# Patient Record
Sex: Male | Born: 1975
Health system: Southern US, Community
[De-identification: ages and names within clinical notes are randomized; demographics above are authoritative.]

## PROBLEM LIST (undated history)

## (undated) DIAGNOSIS — T50Z95A Adverse effect of other vaccines and biological substances, initial encounter: Secondary | ICD-10-CM

## (undated) DIAGNOSIS — E739 Lactose intolerance, unspecified: Secondary | ICD-10-CM

## (undated) DIAGNOSIS — R5383 Other fatigue: Secondary | ICD-10-CM

## (undated) DIAGNOSIS — G4730 Sleep apnea, unspecified: Secondary | ICD-10-CM

## (undated) DIAGNOSIS — M109 Gout, unspecified: Secondary | ICD-10-CM

## (undated) DIAGNOSIS — J45909 Unspecified asthma, uncomplicated: Secondary | ICD-10-CM

## (undated) DIAGNOSIS — R0602 Shortness of breath: Secondary | ICD-10-CM

## (undated) DIAGNOSIS — M2550 Pain in unspecified joint: Secondary | ICD-10-CM

## (undated) DIAGNOSIS — I10 Essential (primary) hypertension: Secondary | ICD-10-CM

## (undated) DIAGNOSIS — K5900 Constipation, unspecified: Secondary | ICD-10-CM

## (undated) HISTORY — DX: Shortness of breath: R06.02

## (undated) HISTORY — DX: Pain in unspecified joint: M25.50

## (undated) HISTORY — DX: Sleep apnea, unspecified: G47.30

## (undated) HISTORY — DX: Unspecified asthma, uncomplicated: J45.909

## (undated) HISTORY — DX: Lactose intolerance, unspecified: E73.9

## (undated) HISTORY — DX: Adverse effect of other vaccines and biological substances, initial encounter: T50.Z95A

## (undated) HISTORY — DX: Other fatigue: R53.83

## (undated) HISTORY — DX: Constipation, unspecified: K59.00

---

## 2009-04-09 ENCOUNTER — Emergency Department (HOSPITAL_COMMUNITY): Admission: EM | Admit: 2009-04-09 | Discharge: 2009-04-09 | Payer: Self-pay | Admitting: Emergency Medicine

## 2017-03-31 DIAGNOSIS — M109 Gout, unspecified: Secondary | ICD-10-CM | POA: Diagnosis not present

## 2017-03-31 DIAGNOSIS — R030 Elevated blood-pressure reading, without diagnosis of hypertension: Secondary | ICD-10-CM | POA: Diagnosis not present

## 2017-05-06 DIAGNOSIS — R030 Elevated blood-pressure reading, without diagnosis of hypertension: Secondary | ICD-10-CM | POA: Diagnosis not present

## 2017-05-06 DIAGNOSIS — Z0000 Encounter for general adult medical examination without abnormal findings: Secondary | ICD-10-CM | POA: Diagnosis not present

## 2017-06-14 DIAGNOSIS — M109 Gout, unspecified: Secondary | ICD-10-CM | POA: Diagnosis not present

## 2017-06-14 DIAGNOSIS — I10 Essential (primary) hypertension: Secondary | ICD-10-CM | POA: Diagnosis not present

## 2017-06-14 DIAGNOSIS — M79671 Pain in right foot: Secondary | ICD-10-CM | POA: Diagnosis not present

## 2017-09-17 DIAGNOSIS — J302 Other seasonal allergic rhinitis: Secondary | ICD-10-CM | POA: Diagnosis not present

## 2017-09-17 DIAGNOSIS — J069 Acute upper respiratory infection, unspecified: Secondary | ICD-10-CM | POA: Diagnosis not present

## 2017-09-17 DIAGNOSIS — J9801 Acute bronchospasm: Secondary | ICD-10-CM | POA: Diagnosis not present

## 2017-12-17 DIAGNOSIS — M109 Gout, unspecified: Secondary | ICD-10-CM | POA: Diagnosis not present

## 2017-12-17 DIAGNOSIS — R21 Rash and other nonspecific skin eruption: Secondary | ICD-10-CM | POA: Diagnosis not present

## 2017-12-17 DIAGNOSIS — I10 Essential (primary) hypertension: Secondary | ICD-10-CM | POA: Diagnosis not present

## 2018-07-07 DIAGNOSIS — Z0000 Encounter for general adult medical examination without abnormal findings: Secondary | ICD-10-CM | POA: Diagnosis not present

## 2018-07-07 DIAGNOSIS — I10 Essential (primary) hypertension: Secondary | ICD-10-CM | POA: Diagnosis not present

## 2018-07-07 DIAGNOSIS — M109 Gout, unspecified: Secondary | ICD-10-CM | POA: Diagnosis not present

## 2020-05-19 ENCOUNTER — Other Ambulatory Visit: Payer: Self-pay

## 2020-05-19 ENCOUNTER — Observation Stay (HOSPITAL_BASED_OUTPATIENT_CLINIC_OR_DEPARTMENT_OTHER)
Admission: EM | Admit: 2020-05-19 | Discharge: 2020-05-20 | Disposition: A | Discharge status: Home / Self Care | Payer: 59 | Attending: Internal Medicine | Admitting: Internal Medicine

## 2020-05-19 ENCOUNTER — Encounter (HOSPITAL_BASED_OUTPATIENT_CLINIC_OR_DEPARTMENT_OTHER): Payer: Self-pay | Admitting: Emergency Medicine

## 2020-05-19 ENCOUNTER — Emergency Department (HOSPITAL_BASED_OUTPATIENT_CLINIC_OR_DEPARTMENT_OTHER): Payer: 59

## 2020-05-19 DIAGNOSIS — E872 Acidosis: Secondary | ICD-10-CM | POA: Diagnosis not present

## 2020-05-19 DIAGNOSIS — R569 Unspecified convulsions: Secondary | ICD-10-CM | POA: Insufficient documentation

## 2020-05-19 DIAGNOSIS — Z79899 Other long term (current) drug therapy: Secondary | ICD-10-CM | POA: Insufficient documentation

## 2020-05-19 DIAGNOSIS — R0602 Shortness of breath: Secondary | ICD-10-CM | POA: Diagnosis present

## 2020-05-19 DIAGNOSIS — I10 Essential (primary) hypertension: Secondary | ICD-10-CM | POA: Diagnosis not present

## 2020-05-19 DIAGNOSIS — Z20822 Contact with and (suspected) exposure to covid-19: Secondary | ICD-10-CM | POA: Diagnosis not present

## 2020-05-19 DIAGNOSIS — E8729 Other acidosis: Secondary | ICD-10-CM

## 2020-05-19 DIAGNOSIS — G9340 Encephalopathy, unspecified: Secondary | ICD-10-CM | POA: Diagnosis present

## 2020-05-19 DIAGNOSIS — R0902 Hypoxemia: Secondary | ICD-10-CM

## 2020-05-19 DIAGNOSIS — R0689 Other abnormalities of breathing: Secondary | ICD-10-CM

## 2020-05-19 DIAGNOSIS — J9601 Acute respiratory failure with hypoxia: Secondary | ICD-10-CM | POA: Diagnosis not present

## 2020-05-19 DIAGNOSIS — J9602 Acute respiratory failure with hypercapnia: Secondary | ICD-10-CM | POA: Insufficient documentation

## 2020-05-19 DIAGNOSIS — G4733 Obstructive sleep apnea (adult) (pediatric): Secondary | ICD-10-CM | POA: Insufficient documentation

## 2020-05-19 HISTORY — DX: Essential (primary) hypertension: I10

## 2020-05-19 HISTORY — DX: Gout, unspecified: M10.9

## 2020-05-19 LAB — I-STAT ARTERIAL BLOOD GAS, ED
Acid-Base Excess: 6 mmol/L — ABNORMAL HIGH (ref 0.0–2.0)
Bicarbonate: 33.6 mmol/L — ABNORMAL HIGH (ref 20.0–28.0)
Calcium, Ion: 1.24 mmol/L (ref 1.15–1.40)
HCT: 44 % (ref 39.0–52.0)
Hemoglobin: 15 g/dL (ref 13.0–17.0)
O2 Saturation: 86 %
Patient temperature: 98.2
Potassium: 3.6 mmol/L (ref 3.5–5.1)
Sodium: 142 mmol/L (ref 135–145)
TCO2: 35 mmol/L — ABNORMAL HIGH (ref 22–32)
pCO2 arterial: 59.3 mmHg — ABNORMAL HIGH (ref 32.0–48.0)
pH, Arterial: 7.36 (ref 7.350–7.450)
pO2, Arterial: 55 mmHg — ABNORMAL LOW (ref 83.0–108.0)

## 2020-05-19 LAB — RESP PANEL BY RT-PCR (FLU A&B, COVID) ARPGX2
Influenza A by PCR: NEGATIVE
Influenza B by PCR: NEGATIVE
SARS Coronavirus 2 by RT PCR: NEGATIVE

## 2020-05-19 LAB — RAPID URINE DRUG SCREEN, HOSP PERFORMED
Amphetamines: NOT DETECTED
Barbiturates: NOT DETECTED
Benzodiazepines: NOT DETECTED
Cocaine: NOT DETECTED
Opiates: NOT DETECTED
Tetrahydrocannabinol: NOT DETECTED

## 2020-05-19 LAB — CBC WITH DIFFERENTIAL/PLATELET
Abs Immature Granulocytes: 0.04 10*3/uL (ref 0.00–0.07)
Basophils Absolute: 0 10*3/uL (ref 0.0–0.1)
Basophils Relative: 1 %
Eosinophils Absolute: 0.1 10*3/uL (ref 0.0–0.5)
Eosinophils Relative: 2 %
HCT: 44.2 % (ref 39.0–52.0)
Hemoglobin: 14.6 g/dL (ref 13.0–17.0)
Immature Granulocytes: 1 %
Lymphocytes Relative: 33 %
Lymphs Abs: 1.9 10*3/uL (ref 0.7–4.0)
MCH: 28.6 pg (ref 26.0–34.0)
MCHC: 33 g/dL (ref 30.0–36.0)
MCV: 86.7 fL (ref 80.0–100.0)
Monocytes Absolute: 0.4 10*3/uL (ref 0.1–1.0)
Monocytes Relative: 6 %
Neutro Abs: 3.3 10*3/uL (ref 1.7–7.7)
Neutrophils Relative %: 57 %
Platelets: 166 10*3/uL (ref 150–400)
RBC: 5.1 MIL/uL (ref 4.22–5.81)
RDW: 14.1 % (ref 11.5–15.5)
WBC: 5.7 10*3/uL (ref 4.0–10.5)
nRBC: 0 % (ref 0.0–0.2)

## 2020-05-19 LAB — BASIC METABOLIC PANEL
Anion gap: 8 (ref 5–15)
BUN: 14 mg/dL (ref 6–20)
CO2: 30 mmol/L (ref 22–32)
Calcium: 8.9 mg/dL (ref 8.9–10.3)
Chloride: 102 mmol/L (ref 98–111)
Creatinine, Ser: 0.92 mg/dL (ref 0.61–1.24)
GFR, Estimated: >60 mL/min (ref >60)
Glucose, Bld: 125 mg/dL — ABNORMAL HIGH (ref 70–99)
Potassium: 3.6 mmol/L (ref 3.5–5.1)
Sodium: 140 mmol/L (ref 135–145)

## 2020-05-19 MED ORDER — IOHEXOL 350 MG/ML SOLN
100.0000 mL | Freq: Once | INTRAVENOUS | Status: AC
  Administered 2020-05-19: 100 mL via INTRAVENOUS

## 2020-05-19 MED ORDER — SODIUM CHLORIDE 0.9% FLUSH
3.0000 mL | Freq: Two times a day (BID) | INTRAVENOUS | Status: DC
  Administered 2020-05-20: 3 mL via INTRAVENOUS

## 2020-05-19 MED ORDER — ONDANSETRON HCL 4 MG/2ML IJ SOLN: 4.0000 mg | Freq: Four times a day (QID) | INTRAMUSCULAR | Status: DC | PRN

## 2020-05-19 MED ORDER — SODIUM CHLORIDE 0.9% FLUSH: 3.0000 mL | INTRAVENOUS | Status: DC | PRN

## 2020-05-19 MED ORDER — ACETAMINOPHEN 325 MG PO TABS
650.0000 mg | ORAL_TABLET | Freq: Once | ORAL | Status: AC
  Administered 2020-05-19: 650 mg via ORAL
  Filled 2020-05-19: qty 2

## 2020-05-19 MED ORDER — SODIUM CHLORIDE 0.9% FLUSH
3.0000 mL | Freq: Two times a day (BID) | INTRAVENOUS | Status: DC
  Administered 2020-05-19: 3 mL via INTRAVENOUS

## 2020-05-19 MED ORDER — ACETAMINOPHEN 650 MG RE SUPP: 650.0000 mg | Freq: Four times a day (QID) | RECTAL | Status: DC | PRN

## 2020-05-19 MED ORDER — ACETAMINOPHEN 325 MG PO TABS: 650.0000 mg | ORAL_TABLET | Freq: Four times a day (QID) | ORAL | Status: DC | PRN

## 2020-05-19 MED ORDER — SODIUM CHLORIDE 0.9 % IV SOLN: 250.0000 mL | INTRAVENOUS | Status: DC | PRN

## 2020-05-19 MED ORDER — ONDANSETRON HCL 4 MG PO TABS: 4.0000 mg | ORAL_TABLET | Freq: Four times a day (QID) | ORAL | Status: DC | PRN

## 2020-05-19 MED ORDER — ONDANSETRON HCL 4 MG/2ML IJ SOLN
INTRAMUSCULAR | Status: AC
  Administered 2020-05-19: 4 mg
  Filled 2020-05-19: qty 2

## 2020-05-19 MED ORDER — ENOXAPARIN SODIUM 40 MG/0.4ML ~~LOC~~ SOLN
40.0000 mg | SUBCUTANEOUS | Status: DC
  Administered 2020-05-19: 40 mg via SUBCUTANEOUS
  Filled 2020-05-19: qty 0.4

## 2020-05-19 NOTE — ED Notes (Signed)
He is resting comfortably and arouses easily. He is oriented x 4 with clear speech. He falls to sleep soon after my assessment.

## 2020-05-19 NOTE — ED Notes (Signed)
Patient returned from CT

## 2020-05-19 NOTE — Progress Notes (Signed)
Pt refusing cpap for the night. ?

## 2020-05-19 NOTE — ED Notes (Signed)
Called bed placement at 10:30 no beds until discharges may be 2-3 o'clock

## 2020-05-19 NOTE — ED Provider Notes (Signed)
[facility] Provider Note: Lowella DellJ. Lane Saraiya Kozma, MD, FACEP  CSN: [id number] MRN: [id number] ARRIVAL: 05/19/20 at 0337 ROOM: [facility]   CHIEF COMPLAINT  Sleep Problem   HISTORY OF PRESENT ILLNESS  05/19/20 4:05 AM Donald Dixon is a 44 y.o. male who is of stocky build.  He has strong family history of obstructive sleep apnea and his wife has long suspected he has obstructive sleep apnea due to heavy snoring and apparent apneic events while sleeping.  He has never had a problem with excessive daytime sleepiness until returning from a 3-week trip to [place] a week ago.  Since then he has been hypersomnolent, even falling asleep during a conversation.   This morning at 0215 his wife awakened finding him on the floor having apparently fallen out of bed.  He was unresponsive, cyanotic and was having difficulty breathing.  Specifically he was having what appeared to be an effort to breathe through an obstruction.  He was incontinent of urine prior to this, but his wife did not witness any tonic-clonic activity when she saw him.  She briefly perform chest compressions and called EMS who arrived and evaluated him.  His blood sugar and blood pressure were adequate.  He did declined transport to the ED.  He subsequently had several episodes of vomiting.  On arrival here his oxygen saturation has been 86-90% on room air.  This improved to 91 to 92% on 2 L by nasal cannula.  The patient states he feels tired but denies any pain or injury.   Past Medical History:  Diagnosis Date  . Gout   . Hypertension     History reviewed. No pertinent surgical history.  No family history on file.  Social History   Tobacco Use  . Smoking status: Never Smoker  . Smokeless tobacco: Never Used  Vaping Use  . Vaping Use: Never used  Substance Use Topics  . Alcohol use: Never  . Drug use: Never    Prior to Admission medications   Medication Sig Start Date End Date Taking? Authorizing Provider    allopurinol (ZYLOPRIM) 100 MG tablet Take 100 mg by mouth daily.   Yes [provider]  lisinopril (ZESTRIL) 5 MG tablet Take 5 mg by mouth daily.   Yes [provider]    Allergies Patient has no known allergies.   REVIEW OF SYSTEMS  Negative except as noted here or in the History of Present Illness.   PHYSICAL EXAMINATION  Initial Vital Signs Blood pressure (!) 133/100, pulse 72, resp. rate (!) 21, height 6' (1.829 m), weight (!) 140.6 kg, SpO2 91 %.  Examination General: Well-developed, obese (BMI 42.04) male in no acute distress; appearance consistent with age of record HENT: normocephalic; superficial bite mark to right anterior tongue; Mallampati 4 airway (no better than 3 with use of tongue depressor) Eyes: pupils equal, round and reactive to light; extraocular muscles intact Neck: supple Heart: regular rate and rhythm Lungs: clear to auscultation bilaterally Abdomen: soft; nondistended; nontender; bowel sounds present Extremities: No deformity; full range of motion; pulses normal Neurologic: Awake, sleepy but conversant and cooperative; oriented; motor function intact in all extremities and symmetric; no facial droop Skin: Warm and dry Psychiatric: Flat affect   RESULTS  Summary of this visit's results, reviewed and interpreted by myself:   EKG Interpretation  Date/Time:  Sunday May 19 2020 03:53:57 EST Ventricular Rate:  70 PR Interval:    QRS Duration: 99 QT Interval:  427 QTC Calculation: 461 R Axis:  40 Text Interpretation: Sinus rhythm Normal ECG No previous ECGs available Confirmed by Ozie Lupe, Jonny Ruiz ([id number]) on 05/19/2020 3:57:17 AM      Laboratory Studies: Results for orders placed or performed during the hospital encounter of 05/19/20 (from the past 24 hour(s))  CBC with Differential/Platelet     Status: None   Collection Time: 05/19/20  4:17 AM  Result Value Ref Range   WBC 5.7 4.0 - 10.5 K/uL   RBC 5.10 4.22 - 5.81 MIL/uL    Hemoglobin 14.6 13.0 - 17.0 g/dL   HCT 96.7 39 - 52 %   MCV 86.7 80.0 - 100.0 fL   MCH 28.6 26.0 - 34.0 pg   MCHC 33.0 30.0 - 36.0 g/dL   RDW 89.3 81.0 - 17.5 %   Platelets 166 150 - 400 K/uL   nRBC 0.0 0.0 - 0.2 %   Neutrophils Relative % 57 %   Neutro Abs 3.3 1.7 - 7.7 K/uL   Lymphocytes Relative 33 %   Lymphs Abs 1.9 0.7 - 4.0 K/uL   Monocytes Relative 6 %   Monocytes Absolute 0.4 0.1 - 1.0 K/uL   Eosinophils Relative 2 %   Eosinophils Absolute 0.1 0.0 - 0.5 K/uL   Basophils Relative 1 %   Basophils Absolute 0.0 0.0 - 0.1 K/uL   Immature Granulocytes 1 %   Abs Immature Granulocytes 0.04 0.00 - 0.07 K/uL  Basic metabolic panel     Status: Abnormal   Collection Time: 05/19/20  4:17 AM  Result Value Ref Range   Sodium 140 135 - 145 mmol/L   Potassium 3.6 3.5 - 5.1 mmol/L   Chloride 102 98 - 111 mmol/L   CO2 30 22 - 32 mmol/L   Glucose, Bld 125 (H) 70 - 99 mg/dL   BUN 14 6 - 20 mg/dL   Creatinine, Ser 1.02 0.61 - 1.24 mg/dL   Calcium 8.9 8.9 - 58.5 mg/dL   GFR, Estimated >27 >78 mL/min   Anion gap 8 5 - 15  I-Stat arterial blood gas, ([facility] ED)     Status: Abnormal   Collection Time: 05/19/20  4:37 AM  Result Value Ref Range   pH, Arterial 7.360 7.35 - 7.45   pCO2 arterial 59.3 (H) 32 - 48 mmHg   pO2, Arterial 55 (L) 83 - 108 mmHg   Bicarbonate 33.6 (H) 20.0 - 28.0 mmol/L   TCO2 35 (H) 22 - 32 mmol/L   O2 Saturation 86.0 %   Acid-Base Excess 6.0 (H) 0.0 - 2.0 mmol/L   Sodium 142 135 - 145 mmol/L   Potassium 3.6 3.5 - 5.1 mmol/L   Calcium, Ion 1.24 1.15 - 1.40 mmol/L   HCT 44.0 39 - 52 %   Hemoglobin 15.0 13.0 - 17.0 g/dL   Patient temperature 24.2 F    Collection site Radial    Drawn by RT    Sample type ARTERIAL   Rapid urine drug screen (hospital performed)     Status: None   Collection Time: 05/19/20  4:44 AM  Result Value Ref Range   Opiates NONE DETECTED NONE DETECTED   Cocaine NONE DETECTED NONE DETECTED   Benzodiazepines NONE DETECTED NONE DETECTED    Amphetamines NONE DETECTED NONE DETECTED   Tetrahydrocannabinol NONE DETECTED NONE DETECTED   Barbiturates NONE DETECTED NONE DETECTED  Resp Panel by RT-PCR (Flu A&B, Covid) Nasopharyngeal Swab     Status: None   Collection Time: 05/19/20  4:44 AM   Specimen: Nasopharyngeal Swab; Nasopharyngeal(NP) swabs in  vial transport medium  Result Value Ref Range   SARS Coronavirus 2 by RT PCR NEGATIVE NEGATIVE   Influenza A by PCR NEGATIVE NEGATIVE   Influenza B by PCR NEGATIVE NEGATIVE   Imaging Studies: CT Head Wo Contrast  Result Date: 05/19/2020 CLINICAL DATA:  44 year old male with fall, altered mental status and shortness of breath. EXAM: CT HEAD WITHOUT CONTRAST TECHNIQUE: Contiguous axial images were obtained from the base of the skull through the vertex without intravenous contrast. COMPARISON:  None. FINDINGS: Brain: No evidence of acute infarction, hemorrhage, hydrocephalus, extra-axial collection or mass lesion/mass effect. Vascular: No hyperdense vessel or unexpected calcification. Skull: Normal. Negative for fracture or focal lesion. Sinuses/Orbits: No acute finding. Other: None. IMPRESSION: Unremarkable noncontrast head CT. Electronically Signed   By: Harmon Pier M.D.   On: 05/19/2020 06:22   CT Angio Chest PE W and/or Wo Contrast  Result Date: 05/19/2020 CLINICAL DATA:  44 year old male with acute shortness of breath. EXAM: CT ANGIOGRAPHY CHEST WITH CONTRAST TECHNIQUE: Multidetector CT imaging of the chest was performed using the standard protocol during bolus administration of intravenous contrast. Multiplanar CT image reconstructions and MIPs were obtained to evaluate the vascular anatomy. CONTRAST:  OMNIPAQUE IOHEXOL 350 MG/ML SOLN COMPARISON:  None. FINDINGS: Cardiovascular: This is a technically satisfactory study. No pulmonary emboli are identified. There is no evidence of thoracic aortic aneurysm or definite dissection. UPPER limits normal heart size noted. No pericardial  effusion is present. Mediastinum/Nodes: No enlarged mediastinal, hilar, or axillary lymph nodes. Thyroid gland, trachea, and esophagus demonstrate no significant findings. Lungs/Pleura: No evidence of airspace disease, consolidation, nodule, mass, pleural effusion or pneumothorax. Mild dependent and bibasilar atelectasis noted. Upper Abdomen: No acute abnormality.  Hepatic steatosis is present. Musculoskeletal: No acute or suspicious bony abnormalities are noted. Review of the MIP images confirms the above findings. IMPRESSION: 1. No evidence of pulmonary emboli or thoracic aortic aneurysm/dissection. 2. Mild dependent and bibasilar atelectasis. 3. Hepatic steatosis. Electronically Signed   By: Harmon Pier M.D.   On: 05/19/2020 06:29    ED COURSE and MDM  Nursing notes, initial and subsequent vitals signs, including pulse oximetry, reviewed and interpreted by myself.  Vitals:   05/19/20 0400 05/19/20 0447 05/19/20 0520 05/19/20 0530  BP: (!) 133/100 (!) 138/105 (!) 143/102 130/89  Pulse: 72 66 77 68  Resp: (!) 21 20 (!) 22 18  Temp: 98.2 F (36.8 C)     TempSrc: Oral     SpO2: 91% 97% 98% 98%  Weight:      Height:       Medications  iohexol (OMNIPAQUE) 350 MG/ML injection 100 mL (100 mLs Intravenous Contrast Given 05/19/20 0452)  ondansetron (ZOFRAN) 4 MG/2ML injection (4 mg  Given 05/19/20 0520)   6:12 AM The patient's history, especially as given by his wife, is strongly suggestive of obstructive sleep apnea.  Obstructive sleep apnea is often associated with excessive daytime sleepiness and also carries a high risk of seizures.  I suspect the apparent life-threatening event his wife witnessed this morning was in fact due to a generalized tonic-clonic seizure.  He was incontinent of urine and has a bite mark to his tongue.  It is interesting that he has only become somnolent in the past week.  A CT angio chest was performed to look for a pulmonary embolism given his recent travel and hypoxia  at rest.  The results of this are pending but no obvious PE was seen by myself.  A CT scan of  the head was done to evaluate for tumor or other pathology and again nothing was seen by myself.  The patient's persistent hypoxia at rest is concerning and we will have him admitted for further work-up.  He could have pulmonary hypertension as a result of longstanding sleep apnea.  He will no doubt need a sleep study as well as a neurologic evaluation.   6:33 AM No significant findings on CT scans.   PROCEDURES  Procedures  CRITICAL CARE Performed by: Carlisle Beers Melinna Linarez Total critical care time: 35 minutes Critical care time was exclusive of separately billable procedures and treating other patients. Critical care was necessary to treat or prevent imminent or life-threatening deterioration. Critical care was time spent personally by me on the following activities: development of treatment plan with patient and/or surrogate as well as nursing, discussions with consultants, evaluation of patient's response to treatment, examination of patient, obtaining history from patient or surrogate, ordering and performing treatments and interventions, ordering and review of laboratory studies, ordering and review of radiographic studies, pulse oximetry and re-evaluation of patient's condition.   ED DIAGNOSES     ICD-10-CM   1. Hypoxia  R09.02   2. Hypercapnia  R06.89   3. Seizure (HCC)  R56.9   4. Obstructive sleep apnea syndrome in adult  G47.33   5. Compensated respiratory acidosis  E87.2        Suheily Birks, Jonny Ruiz, MD 05/19/20 2229

## 2020-05-19 NOTE — ED Notes (Signed)
I have just given report to Luckey, Charity fundraiser at [facility]; and also to Franklin, Charity fundraiser with [employer]. Pt. And his wife are aware of, and agree with plan to admit.

## 2020-05-19 NOTE — ED Triage Notes (Signed)
Pt comes in with wife who states she woke up to him falling out of bed at 0220. She describes him as "having a blue face, and struggling to breathe". She states she started compressions on him and called 911. She reports that ~1 min before EMS arrived that he "came to". He states he does not remember anything, and "felt fine" once he woke up so he elected to not go to the ED. Since this incident he has vomited x 2. Oxygen saturation on arrival to room 89% on RA and fluctuates between 86-90%. Pt states he "feels fine just tired". Wife states he had recent out of town travel ~ 7 hrs away and has been back home x 1 week. She states that during that week he has been sleepy, and falling asleep at inappropriate times, even during conversation which is not usual for him. He is unsure what he hit when falling out of bed but states "nothing hurts".

## 2020-05-19 NOTE — Progress Notes (Signed)
Arterial blood gas was collected while patient was on room air.

## 2020-05-19 NOTE — ED Notes (Signed)
Pt vomiting. Verbal order to override zofran obtained.

## 2020-05-19 NOTE — ED Notes (Signed)
[facility] loads him onto their truck for transport without incident.

## 2020-05-19 NOTE — ED Notes (Signed)
Gave report to Vernona Rieger RRT, RCP at [facility] for bed [phone]

## 2020-05-19 NOTE — ED Notes (Signed)
He c/o generalized h/a. Med. Given for same.

## 2020-05-19 NOTE — Progress Notes (Signed)
Pt has been admitted to the unit via [facility]. Pt denies pain and has all belongings at bedside. Pt has wife at bedside and has all equipment delivered and i   05/19/20 1733  Vitals  BP (!) 152/106  MAP (mmHg) 121  BP Location Left Arm  BP Method Automatic  Patient Position (if appropriate) Lying  Pulse Rate 88  Pulse Rate Source Dinamap  Resp 20  Level of Consciousness  Level of Consciousness Alert  MEWS COLOR  MEWS Score Color Green  Oxygen Therapy  SpO2 97 %  O2 Device Nasal Cannula  O2 Flow Rate (L/min) 4 L/min  Pain Assessment  Pain Scale 0-10  Pain Score 0  ECG Monitoring  Tele Box Verification Completed by Second Verifier Completed  MEWS Score  MEWS Temp 0  MEWS Systolic 0  MEWS Pulse 0  MEWS RR 0  MEWS LOC 0  MEWS Score 0  ntact. Telephone and call light are within reach.

## 2020-05-19 NOTE — ED Notes (Signed)
Patient transported to CT 

## 2020-05-19 NOTE — H&P (Signed)
History and Physical    Vaughan BastaBenjamin Claw ZOX:[id number] DOB: 08/11/1975 DOA: 05/19/2020  PCP: Patient, [name]   Patient coming from: Home   Chief Complaint: Transient loss of awareness, respiratory distress, somnolence  HPI: Vaughan BastaBenjamin Peter is a 44 y.o. male with medical history significant for hypertension, gout, and BMI 42, now presenting to the emergency department for evaluation after an episode of transient loss of awareness and respiratory distress.  Patient had recently returned from traveling, was noted by his wife to be more sleepy than usual, sometimes falling asleep while she was talking to him, and then heard him fall out of the bed at approximately 2:20 AM this morning, it appeared that he was struggling to breathe, was cyanotic, and she called 911 and briefly performed chest compressions.  Patient woke and did not have any complaints, said he felt fine, and declined transportation to the ED at that time.  He denies any tongue bite or other injury during this episode but did have urinary incontinence.  Denies headache, change in vision or hearing, or focal numbness or weakness.  Denies any history of seizures and denies any similar episode previously.  Patient's wife has suspected that he has undiagnosed sleep apnea.   [facility] ED Course: Upon arrival to the ED, patient is found to be afebrile, saturating in the upper 80s on room air, and with remaining vitals stable.  EKG features a sinus rhythm.  Noncontrast head CT negative for acute intracranial abnormality.  CTA chest is negative for PE and notable for hepatic steatosis and minimal bibasilar atelectasis.  Chemistry panel and CBC are unremarkable.  COVID-19 PCR is negative.  Arterial blood gas features a pH 7.36, pCO2 59, and pO2 of 35 on room air.  Patient was placed on 3 L/min of supplemental oxygen, treatment with Tylenol, [facility]  Review of Systems:  All other  systems reviewed and apart from HPI, are negative.  Past Medical History:  Diagnosis Date  . Gout   . Hypertension     History reviewed. No pertinent surgical history.  Social History:   reports that he has never smoked. He has never used smokeless tobacco. He reports that he does not drink alcohol and does not use drugs.  No Known Allergies  History reviewed. No pertinent family history.   Prior to Admission medications   Medication Sig Start Date End Date Taking? Authorizing Provider  allopurinol (ZYLOPRIM) 100 MG tablet Take 100 mg by mouth daily.   Yes [provider]  lisinopril (ZESTRIL) 5 MG tablet Take 5 mg by mouth daily.   Yes [provider]    Physical Exam: Vitals:   05/19/20 1615 05/19/20 1630 05/19/20 1733 05/19/20 2059  BP:  (!) 148/92 (!) 152/106 (!) 150/99  Pulse: 92 88 88 79  Resp: 16 19 20 18   Temp:   98.4 F (36.9 C) 98 F (36.7 C)  TempSrc:   Oral Oral  SpO2: 97% 98% 97% 94%  Weight:      Height:        Constitutional: NAD, calm  Eyes: PERTLA, lids and conjunctivae normal ENMT: Mucous membranes are moist. Posterior pharynx clear of any exudate or lesions.   Neck: normal, supple, no masses, no thyromegaly Respiratory: no wheezing, no crackles. No accessory muscle use.  Cardiovascular: S1 & S2 heard, regular rate and rhythm. No extremity edema.   Abdomen: No distension, no tenderness, soft. Bowel sounds active.  Musculoskeletal:  no clubbing / cyanosis. No joint deformity upper and lower extremities.   Skin: no significant rashes, lesions, ulcers. Warm, dry, well-perfused. Neurologic: CN 2-12 grossly intact. Sensation intact. Strength 5/5 in all 4 limbs.  Psychiatric: Alert and oriented to person, place, and situation. Pleasant and cooperative.    Labs and Imaging on Admission: I have personally reviewed following labs and imaging studies  CBC: Recent Labs  Lab 05/19/20 0417 05/19/20 0437  WBC 5.7  --   NEUTROABS 3.3  --    HGB 14.6 15.0  HCT 44.2 44.0  MCV 86.7  --   PLT 166  --    Basic Metabolic Panel: Recent Labs  Lab 05/19/20 0417 05/19/20 0437  NA 140 142  K 3.6 3.6  CL 102  --   CO2 30  --   GLUCOSE 125*  --   BUN 14  --   CREATININE 0.92  --   CALCIUM 8.9  --    GFR: Estimated Creatinine Clearance: 150.5 mL/min (by C-G formula based on SCr of 0.92 mg/dL). Liver Function Tests: No results for input(s): AST, ALT, ALKPHOS, BILITOT, PROT, ALBUMIN in the last 168 hours. No results for input(s): LIPASE, AMYLASE in the last 168 hours. No results for input(s): AMMONIA in the last 168 hours. Coagulation Profile: No results for input(s): INR, PROTIME in the last 168 hours. Cardiac Enzymes: No results for input(s): CKTOTAL, CKMB, CKMBINDEX, TROPONINI in the last 168 hours. BNP (last 3 results) No results for input(s): PROBNP in the last 8760 hours. HbA1C: No results for input(s): HGBA1C in the last 72 hours. CBG: No results for input(s): GLUCAP in the last 168 hours. Lipid Profile: No results for input(s): CHOL, HDL, LDLCALC, TRIG, CHOLHDL, LDLDIRECT in the last 72 hours. Thyroid Function Tests: No results for input(s): TSH, T4TOTAL, FREET4, T3FREE, THYROIDAB in the last 72 hours. Anemia Panel: No results for input(s): VITAMINB12, FOLATE, FERRITIN, TIBC, IRON, RETICCTPCT in the last 72 hours. Urine analysis: No results found for: COLORURINE, APPEARANCEUR, LABSPEC, PHURINE, GLUCOSEU, HGBUR, BILIRUBINUR, KETONESUR, PROTEINUR, UROBILINOGEN, NITRITE, LEUKOCYTESUR Sepsis Labs: @LABRCNTIP (procalcitonin:4,lacticidven:4) ) Recent Results (from the past 240 hour(s))  Resp Panel by RT-PCR (Flu A&B, Covid) Nasopharyngeal Swab     Status: None   Collection Time: 05/19/20  4:44 AM   Specimen: Nasopharyngeal Swab; Nasopharyngeal(NP) swabs in vial transport medium  Result Value Ref Range Status   SARS Coronavirus 2 by RT PCR NEGATIVE NEGATIVE Final    Comment: (NOTE) SARS-CoV-2 target nucleic acids  are NOT DETECTED.  The SARS-CoV-2 RNA is generally detectable in upper respiratory specimens during the acute phase of infection. The lowest concentration of SARS-CoV-2 viral copies this assay can detect is 138 copies/mL. A negative result does not preclude SARS-Cov-2 infection and should not be used as the sole basis for treatment or other patient management decisions. A negative result may occur with  improper specimen collection/handling, submission of specimen other than nasopharyngeal swab, presence of viral mutation(s) within the areas targeted by this assay, and inadequate number of viral copies(<138 copies/mL). A negative result must be combined with clinical observations, patient history, and epidemiological information. The expected result is Negative.  Fact Sheet for Patients:  05/21/20  Fact Sheet for Healthcare Providers:  BloggerCourse.com  This test is no t yet approved or cleared by the [place] FDA and  has been authorized for detection and/or diagnosis of SARS-CoV-2 by FDA under an Emergency Use Authorization (EUA). This EUA will remain  in effect (meaning this test can be  used) for the duration of the COVID-19 declaration under Section 564(b)(1) of the Act, 21 U.S.C.section 360bbb-3(b)(1), unless the authorization is terminated  or revoked sooner.       Influenza A by PCR NEGATIVE NEGATIVE Final   Influenza B by PCR NEGATIVE NEGATIVE Final    Comment: (NOTE) The Xpert Xpress SARS-CoV-2/FLU/RSV plus assay is intended as an aid in the diagnosis of influenza from Nasopharyngeal swab specimens and should not be used as a sole basis for treatment. Nasal washings and aspirates are unacceptable for Xpert Xpress SARS-CoV-2/FLU/RSV testing.  Fact Sheet for Patients: BloggerCourse.com  Fact Sheet for Healthcare Providers: SeriousBroker.it  This test is  not yet approved or cleared by the [place] FDA and has been authorized for detection and/or diagnosis of SARS-CoV-2 by FDA under an Emergency Use Authorization (EUA). This EUA will remain in effect (meaning this test can be used) for the duration of the COVID-19 declaration under Section 564(b)(1) of the Act, 21 U.S.C. section 360bbb-3(b)(1), unless the authorization is terminated or revoked.  Performed at [facility], [address] [place], [place] [address]      Radiological Exams on Admission: CT Head Wo Contrast  Result Date: 05/19/2020 CLINICAL DATA:  44 year old male with fall, altered mental status and shortness of breath. EXAM: CT HEAD WITHOUT CONTRAST TECHNIQUE: Contiguous axial images were obtained from the base of the skull through the vertex without intravenous contrast. COMPARISON:  None. FINDINGS: Brain: No evidence of acute infarction, hemorrhage, hydrocephalus, extra-axial collection or mass lesion/mass effect. Vascular: No hyperdense vessel or unexpected calcification. Skull: Normal. Negative for fracture or focal lesion. Sinuses/Orbits: No acute finding. Other: None. IMPRESSION: Unremarkable noncontrast head CT. Electronically Signed   By: Harmon Pier M.D.   On: 05/19/2020 06:22   CT Angio Chest PE W and/or Wo Contrast  Result Date: 05/19/2020 CLINICAL DATA:  44 year old male with acute shortness of breath. EXAM: CT ANGIOGRAPHY CHEST WITH CONTRAST TECHNIQUE: Multidetector CT imaging of the chest was performed using the standard protocol during bolus administration of intravenous contrast. Multiplanar CT image reconstructions and MIPs were obtained to evaluate the vascular anatomy. CONTRAST:  OMNIPAQUE IOHEXOL 350 MG/ML SOLN COMPARISON:  None. FINDINGS: Cardiovascular: This is a technically satisfactory study. No pulmonary emboli are identified. There is no evidence of thoracic aortic aneurysm or definite dissection. UPPER limits normal heart size  noted. No pericardial effusion is present. Mediastinum/Nodes: No enlarged mediastinal, hilar, or axillary lymph nodes. Thyroid gland, trachea, and esophagus demonstrate no significant findings. Lungs/Pleura: No evidence of airspace disease, consolidation, nodule, mass, pleural effusion or pneumothorax. Mild dependent and bibasilar atelectasis noted. Upper Abdomen: No acute abnormality.  Hepatic steatosis is present. Musculoskeletal: No acute or suspicious bony abnormalities are noted. Review of the MIP images confirms the above findings. IMPRESSION: 1. No evidence of pulmonary emboli or thoracic aortic aneurysm/dissection. 2. Mild dependent and bibasilar atelectasis. 3. Hepatic steatosis. Electronically Signed   By: Harmon Pier M.D.   On: 05/19/2020 06:29    EKG: Independently reviewed. Sinus rhythm.   Assessment/Plan   1. Acute hypoxic and hypercarbic respiratory failure  - Presents after a transient loss of awareness with cyanosis, had no acute findings on CTA chest, mild hypoxia and hypercarbia on ABG several hours later, BMI is 42 and he is suspected by wife to have OSA  - Continue supplemental O2 as needed, CPAP qHS, repeat ABG in am    2. Transient loss of awareness  - Presents after found to by wife  to be cyanotic and poorly responsive, quickly returned to baseline  - There was no shaking noted and he denies associate injury but had urinary incontinence  - He was mildly hypoxic and hypercarbic on blood gas several hours later in ED, had negative head CT, normal EKG, no infectious s/s, and normal electroltyes - Seizure possible, possibly precipitated by hypoxia  - Continue seizure precautions, supplemental O2, CPAP qHS, and check EEG    3. Hypertension  - Pharmacy medication-reconciliation pending, will treat as-needed only for now     DVT prophylaxis: Lovenox  Code Status: Full  Family Communication: Discussed with patient  Disposition Plan:  Patient is from: Home  Anticipated d/c  is to: Home  Anticipated d/c date is: Possibly as early as 05/20/20 Patient currently: Pending EEG, trial on CPAP  Consults called: None  Admission status: Observation     Briscoe Deutscher, MD [facility]  05/19/2020, 9:36 PM

## 2020-05-19 NOTE — ED Notes (Signed)
Called at 2:08 no DC yet

## 2020-05-20 DIAGNOSIS — J9601 Acute respiratory failure with hypoxia: Secondary | ICD-10-CM | POA: Diagnosis not present

## 2020-05-20 DIAGNOSIS — G4733 Obstructive sleep apnea (adult) (pediatric): Secondary | ICD-10-CM | POA: Diagnosis present

## 2020-05-20 DIAGNOSIS — J9602 Acute respiratory failure with hypercapnia: Secondary | ICD-10-CM | POA: Diagnosis not present

## 2020-05-20 LAB — BASIC METABOLIC PANEL
Anion gap: 10 (ref 5–15)
BUN: 10 mg/dL (ref 6–20)
CO2: 32 mmol/L (ref 22–32)
Calcium: 9 mg/dL (ref 8.9–10.3)
Chloride: 99 mmol/L (ref 98–111)
Creatinine, Ser: 0.83 mg/dL (ref 0.61–1.24)
GFR, Estimated: >60 mL/min (ref >60)
Glucose, Bld: 103 mg/dL — ABNORMAL HIGH (ref 70–99)
Potassium: 3.6 mmol/L (ref 3.5–5.1)
Sodium: 141 mmol/L (ref 135–145)

## 2020-05-20 LAB — CBC
HCT: 46.5 % (ref 39.0–52.0)
Hemoglobin: 15.1 g/dL (ref 13.0–17.0)
MCH: 28.3 pg (ref 26.0–34.0)
MCHC: 32.5 g/dL (ref 30.0–36.0)
MCV: 87.1 fL (ref 80.0–100.0)
Platelets: 170 10*3/uL (ref 150–400)
RBC: 5.34 MIL/uL (ref 4.22–5.81)
RDW: 14 % (ref 11.5–15.5)
WBC: 6.6 10*3/uL (ref 4.0–10.5)
nRBC: 0 % (ref 0.0–0.2)

## 2020-05-20 LAB — BLOOD GAS, ARTERIAL
Acid-Base Excess: 7.8 mmol/L — ABNORMAL HIGH (ref 0.0–2.0)
Bicarbonate: 33.3 mmol/L — ABNORMAL HIGH (ref 20.0–28.0)
Drawn by: 51133
FIO2: 28
O2 Saturation: 94.8 %
Patient temperature: 36.5
pCO2 arterial: 59.2 mmHg — ABNORMAL HIGH (ref 32.0–48.0)
pH, Arterial: 7.366 (ref 7.350–7.450)
pO2, Arterial: 73.3 mmHg — ABNORMAL LOW (ref 83.0–108.0)

## 2020-05-20 LAB — HIV ANTIBODY (ROUTINE TESTING W REFLEX): HIV Screen 4th Generation wRfx: NONREACTIVE

## 2020-05-20 MED ORDER — LABETALOL HCL 5 MG/ML IV SOLN: 10.0000 mg | INTRAVENOUS | Status: DC | PRN

## 2020-05-20 MED ORDER — LISINOPRIL 5 MG PO TABS
5.0000 mg | ORAL_TABLET | Freq: Every day | ORAL | Status: DC
  Administered 2020-05-20: 5 mg via ORAL
  Filled 2020-05-20: qty 1

## 2020-05-20 MED ORDER — ALLOPURINOL 100 MG PO TABS
100.0000 mg | ORAL_TABLET | Freq: Every day | ORAL | Status: DC
  Administered 2020-05-20: 100 mg via ORAL
  Filled 2020-05-20: qty 1

## 2020-05-20 NOTE — Progress Notes (Signed)
Pt has been discharged via wheelchair. AVS documentation has been given and reviewed. Pt has all belongings sent with them. Pt has all IV removed and is sent in good spirits.

## 2020-05-20 NOTE — Progress Notes (Signed)
ABG sent to lab, lab aware.

## 2020-05-20 NOTE — TOC Transition Note (Signed)
Transition of Care [facility]) - CM/SW Discharge Note   Patient Details  Name: Exander Shaul MRN: [id number] Date of Birth: 05-Sep-1975  [facility] CM/SW Contact:  Pollie Friar, RN Phone Number: 05/20/2020, 12:42 PM   Clinical Narrative:    Pt is discharging home with self care. CM met with the patient and his spouse about home oxygen. Pt did not qualify for his insurance to pay. CM inquired about self pay. They currently do not want home oxygen. They will f/u with pulmonary for a sleep study.  Wife to provide transport home.   Final next level of care: Home/Self Care Barriers to Discharge: No Barriers Identified   Patient Goals and CMS Choice        Discharge Placement                       Discharge Plan and Services                                     Social Determinants of Health (SDOH) Interventions     Readmission Risk Interventions No flowsheet data found.

## 2020-05-20 NOTE — Discharge Summary (Signed)
Physician Discharge Summary  Donald Dixon ZOX:[id number] DOB: 03/19/1976 DOA: 05/19/2020  PCP: Patient, [name]  Admit date: 05/19/2020 Discharge date: 05/20/2020  Admitted From: Home Discharge disposition: Home   Code Status: Full Code  Diet Recommendation: Cardiac diet  Discharge Diagnosis:   Principal Problem:   Acute respiratory failure with hypoxia and hypercarbia (HCC) Active Problems:   Acute encephalopathy   Essential hypertension   Obstructive sleep apnea  History of Present Illness / Brief narrative:  Donald Dixon is a 44 y.o. male with PMH significant for morbid obesity, hypertension, gout. On 05/19/2020, patient presented to the ED from home after an episode of transient loss of awareness and respiratory stress. Around 2 AM on the day of admission, she heard him fall out of the bed, she noted that he was struggling to breathe and was cyanotic.  She started chest compressions on him and called 911.  About a minute before EMS arrived, he woke up and felt fine.  He did not want to come to the ED with EMS at that time.   Since then, he vomited twice and hence his wife drove him to the ED at [facility] later.    Of note, patient has not been diagnosed with sleep apnea but his wife thinks he does have sleep apnea.  Patient recently travel 7 hours away and has been back home for a week.  She states that during that week he has been more sleepy, falling asleep at inappropriate times and even during conversation with her.    On arrival, his oxygen saturation was at 89% on room air, he was placed on 3 L oxygen by nasal cannula.  Arterial blood gas showed pH of 7.36 with PCO2 59, PO2 35 on room air. CBC and BMP unremarkable. Covid PCR negative. EKG normal sinus rhythm. CT head normal. CT chest negative for pulmonary lesion and showed hepatic steatosis with minimal bibasilar atelectasis. Patient directly admitted to hospitalist service at [facility].  Subjective:  Seen and examined this morning.  Pleasant young Caucasian male.  Morbidly obese.  Not in distress.  Wife at bedside.  Hospital Course:  Acute hypoxic and hypercarbic respiratory failure -Presented with transient loss of awareness and cyanosis probably related to apneic episode with untreated sleep apnea. -CT angio of chest did not show any evidence of pneumonia, embolism, effusion or edema. -Blood gas with pH on the low side of normal with elevated PCO2 and serum bicarb level, likely reflecting chronic progressive compensated respiratory acidosis. -Initially required supplemental oxygen by nasal cannula. -On ambulation today, patient maintained oxygen saturation 90 to 93% without symptoms and without supplemental oxygen.    Untreated sleep apnea -Although patient has not a formal diagnosis of sleep apnea, his symptoms are consistent with sleep apnea. He is morbidly obese, wife reports snoring, apneic episodes at night, increased inappropriate sleepiness and daytime fatigue. -Patient and his wife wanted to see if he can get any kind of sleep study as an inpatient.  I discussed with neurology Dr. Pearlean BrownieSethi patient could qualify for one of the trials that is being done in the hospital for sleep apnea screening.  Apparently it is only reserved for stroke patients.  So patient does not qualify for inpatient sleep study. -Patient and his wife are concerned about the risk of him having apneic attack again. -I ordered for an ambulatory referral for sleep study as an outpatient. -In the meantime, I recommend patient to afford oxygen for nocturnal use.  Morbid obesity - Body mass index is 42.04 kg/m. Patient has been advised to make an attempt to improve diet and exercise patterns to aid in weight loss.  Hypertension -On lisinopril 10 mg daily at home.  Continue same.  Gout -Continue allopurinol.  Wound care:    Discharge Exam:   Vitals:   05/20/20 0819 05/20/20 1031  05/20/20 1130 05/20/20 1200  BP: (!) 138/104 (!) 139/104  (!) 150/110  Pulse: 83   92  Resp: 18   18  Temp: 98 F (36.7 C)   98.5 F (36.9 C)  TempSrc: Oral   Oral  SpO2: 96%  92% 92%  Weight:      Height:        Body mass index is 42.04 kg/m.  General exam: Morbidly obese.  Not in physical distress Skin: No rashes, lesions or ulcers. HEENT: Atraumatic, normocephalic, no obvious bleeding Lungs: Clear to auscultation bilaterally CVS: Regular rate and rhythm, no murmur GI/Abd soft, distended, nontender, bowel sound present CNS: Alert, awake, oriented x3 Psychiatry: Mood appropriate Extremities: No pedal edema, no calf tenderness  Follow ups:   Discharge Instructions    Ambulatory referral to Sleep Studies   Complete by: As directed    Diet - low sodium heart healthy   Complete by: As directed    Diet Carb Modified   Complete by: As directed    Increase activity slowly   Complete by: As directed       Follow-up Information    [facility] Follow up.   Contact information: [address] [place] [address] [phone]       [name] Follow up.   Specialty: Neurology Why: For sleep study Contact information: [address] [place] [address] [phone]              Recommendations for Outpatient Follow-Up:   1. Follow-up with PCP as an outpatient 2. Follow-up with sleep center as an outpatient  Discharge Instructions:  Follow with Primary MD Patient, [name] in 7 days   Get CBC/BMP checked in next visit within 1 week by PCP or SNF MD ( we routinely change or add medications that can affect your baseline labs and fluid status, therefore we recommend that you get the mentioned basic workup next visit with your PCP, your PCP may decide not to get them or add new tests based on their clinical decision)  On your next visit with your PCP, please Get  Medicines reviewed and adjusted.  Please request your PCP  to go over all Hospital Tests and Procedure/Radiological results at the follow up, please get all Hospital records sent to your Prim MD by signing hospital release before you go home.  Activity: As tolerated with Full fall precautions use walker/cane & assistance as needed  For Heart failure patients - Check your Weight same time everyday, if you gain over 2 pounds, or you develop in leg swelling, experience more shortness of breath or chest pain, call your Primary MD immediately. Follow Cardiac Low Salt Diet and 1.5 lit/day fluid restriction.  If you have smoked or chewed Tobacco in the last 2 yrs please stop smoking, stop any regular Alcohol  and or any Recreational drug use.  If you experience worsening of your admission symptoms, develop shortness of breath, life threatening emergency, suicidal or homicidal thoughts you must seek medical attention immediately by calling 911 or calling your MD immediately  if  symptoms less severe.  You Must read complete instructions/literature along with all the possible adverse reactions/side effects for all the Medicines you take and that have been prescribed to you. Take any new Medicines after you have completely understood and accpet all the possible adverse reactions/side effects.   Do not drive, operate heavy machinery, perform activities at heights, swimming or participation in water activities or provide baby sitting services if your were admitted for syncope or siezures until you have seen by Primary MD or a Neurologist and advised to do so again.  Do not drive when taking Pain medications.  Do not take more than prescribed Pain, Sleep and Anxiety Medications  Wear Seat belts while driving.   Please note You were cared for by a hospitalist during your hospital stay. If you have any questions about your discharge medications or the care you received while you were in the hospital after you  are discharged, you can call the unit and asked to speak with the hospitalist on call if the hospitalist that took care of you is not available. Once you are discharged, your primary care physician will handle any further medical issues. Please note that NO REFILLS for any discharge medications will be authorized once you are discharged, as it is imperative that you return to your primary care physician (or establish a relationship with a primary care physician if you do not have one) for your aftercare needs so that they can reassess your need for medications and monitor your lab values.    Allergies as of 05/20/2020   No Known Allergies     Medication List    TAKE these medications   allopurinol 100 MG tablet Commonly known as: ZYLOPRIM Take 100 mg by mouth daily.   lisinopril 10 MG tablet Commonly known as: ZESTRIL Take 10 mg by mouth daily.            Durable Medical Equipment  (From admission, onward)         Start     Ordered   05/20/20 1202  For home use only DME oxygen  Once       Comments: For sleep apnea  Question Answer Comment  Length of Need 6 Months   Mode or (Route) Nasal cannula   Liters per Minute 3   Frequency Only at night (stationary unit needed)   Oxygen conserving device Yes   Oxygen delivery system Gas      05/20/20 1202          Time coordinating discharge: 35 minutes  The results of significant diagnostics from this hospitalization (including imaging, microbiology, ancillary and laboratory) are listed below for reference.    Procedures and Diagnostic Studies:   CT Head Wo Contrast  Result Date: 05/19/2020 CLINICAL DATA:  44 year old male with fall, altered mental status and shortness of breath. EXAM: CT HEAD WITHOUT CONTRAST TECHNIQUE: Contiguous axial images were obtained from the base of the skull through the vertex without intravenous contrast. COMPARISON:  None. FINDINGS: Brain: No evidence of acute infarction, hemorrhage,  hydrocephalus, extra-axial collection or mass lesion/mass effect. Vascular: No hyperdense vessel or unexpected calcification. Skull: Normal. Negative for fracture or focal lesion. Sinuses/Orbits: No acute finding. Other: None. IMPRESSION: Unremarkable noncontrast head CT. Electronically Signed   By: Harmon Pier M.D.   On: 05/19/2020 06:22   CT Angio Chest PE W and/or Wo Contrast  Result Date: 05/19/2020 CLINICAL DATA:  44 year old male with acute shortness of breath. EXAM: CT ANGIOGRAPHY CHEST WITH CONTRAST TECHNIQUE:  Multidetector CT imaging of the chest was performed using the standard protocol during bolus administration of intravenous contrast. Multiplanar CT image reconstructions and MIPs were obtained to evaluate the vascular anatomy. CONTRAST:  OMNIPAQUE IOHEXOL 350 MG/ML SOLN COMPARISON:  None. FINDINGS: Cardiovascular: This is a technically satisfactory study. No pulmonary emboli are identified. There is no evidence of thoracic aortic aneurysm or definite dissection. UPPER limits normal heart size noted. No pericardial effusion is present. Mediastinum/Nodes: No enlarged mediastinal, hilar, or axillary lymph nodes. Thyroid gland, trachea, and esophagus demonstrate no significant findings. Lungs/Pleura: No evidence of airspace disease, consolidation, nodule, mass, pleural effusion or pneumothorax. Mild dependent and bibasilar atelectasis noted. Upper Abdomen: No acute abnormality.  Hepatic steatosis is present. Musculoskeletal: No acute or suspicious bony abnormalities are noted. Review of the MIP images confirms the above findings. IMPRESSION: 1. No evidence of pulmonary emboli or thoracic aortic aneurysm/dissection. 2. Mild dependent and bibasilar atelectasis. 3. Hepatic steatosis. Electronically Signed   By: Harmon Pier M.D.   On: 05/19/2020 06:29     Labs:   Basic Metabolic Panel: Recent Labs  Lab 05/19/20 0417 05/19/20 0417 05/19/20 0437 05/20/20 0236  NA 140  --  142 141  K 3.6    < > 3.6 3.6  CL 102  --   --  99  CO2 30  --   --  32  GLUCOSE 125*  --   --  103*  BUN 14  --   --  10  CREATININE 0.92  --   --  0.83  CALCIUM 8.9  --   --  9.0   < > = values in this interval not displayed.   GFR Estimated Creatinine Clearance: 166.9 mL/min (by C-G formula based on SCr of 0.83 mg/dL). Liver Function Tests: No results for input(s): AST, ALT, ALKPHOS, BILITOT, PROT, ALBUMIN in the last 168 hours. No results for input(s): LIPASE, AMYLASE in the last 168 hours. No results for input(s): AMMONIA in the last 168 hours. Coagulation profile No results for input(s): INR, PROTIME in the last 168 hours.  CBC: Recent Labs  Lab 05/19/20 0417 05/19/20 0437 05/20/20 0236  WBC 5.7  --  6.6  NEUTROABS 3.3  --   --   HGB 14.6 15.0 15.1  HCT 44.2 44.0 46.5  MCV 86.7  --  87.1  PLT 166  --  170   Cardiac Enzymes: No results for input(s): CKTOTAL, CKMB, CKMBINDEX, TROPONINI in the last 168 hours. BNP: Invalid input(s): POCBNP CBG: No results for input(s): GLUCAP in the last 168 hours. D-Dimer No results for input(s): DDIMER in the last 72 hours. Hgb A1c No results for input(s): HGBA1C in the last 72 hours. Lipid Profile No results for input(s): CHOL, HDL, LDLCALC, TRIG, CHOLHDL, LDLDIRECT in the last 72 hours. Thyroid function studies No results for input(s): TSH, T4TOTAL, T3FREE, THYROIDAB in the last 72 hours.  Invalid input(s): FREET3 Anemia work up No results for input(s): VITAMINB12, FOLATE, FERRITIN, TIBC, IRON, RETICCTPCT in the last 72 hours. Microbiology Recent Results (from the past 240 hour(s))  Resp Panel by RT-PCR (Flu A&B, Covid) Nasopharyngeal Swab     Status: None   Collection Time: 05/19/20  4:44 AM   Specimen: Nasopharyngeal Swab; Nasopharyngeal(NP) swabs in vial transport medium  Result Value Ref Range Status   SARS Coronavirus 2 by RT PCR NEGATIVE NEGATIVE Final    Comment: (NOTE) SARS-CoV-2 target nucleic acids are NOT DETECTED.  The  SARS-CoV-2 RNA is generally detectable in upper  respiratory specimens during the acute phase of infection. The lowest concentration of SARS-CoV-2 viral copies this assay can detect is 138 copies/mL. A negative result does not preclude SARS-Cov-2 infection and should not be used as the sole basis for treatment or other patient management decisions. A negative result may occur with  improper specimen collection/handling, submission of specimen other than nasopharyngeal swab, presence of viral mutation(s) within the areas targeted by this assay, and inadequate number of viral copies(<138 copies/mL). A negative result must be combined with clinical observations, patient history, and epidemiological information. The expected result is Negative.  Fact Sheet for Patients:  BloggerCourse.com  Fact Sheet for Healthcare Providers:  SeriousBroker.it  This test is no t yet approved or cleared by the [place] FDA and  has been authorized for detection and/or diagnosis of SARS-CoV-2 by FDA under an Emergency Use Authorization (EUA). This EUA will remain  in effect (meaning this test can be used) for the duration of the COVID-19 declaration under Section 564(b)(1) of the Act, 21 U.S.C.section 360bbb-3(b)(1), unless the authorization is terminated  or revoked sooner.       Influenza A by PCR NEGATIVE NEGATIVE Final   Influenza B by PCR NEGATIVE NEGATIVE Final    Comment: (NOTE) The Xpert Xpress SARS-CoV-2/FLU/RSV plus assay is intended as an aid in the diagnosis of influenza from Nasopharyngeal swab specimens and should not be used as a sole basis for treatment. Nasal washings and aspirates are unacceptable for Xpert Xpress SARS-CoV-2/FLU/RSV testing.  Fact Sheet for Patients: BloggerCourse.com  Fact Sheet for Healthcare Providers: SeriousBroker.it  This test is not yet approved or  cleared by the [place] FDA and has been authorized for detection and/or diagnosis of SARS-CoV-2 by FDA under an Emergency Use Authorization (EUA). This EUA will remain in effect (meaning this test can be used) for the duration of the COVID-19 declaration under Section 564(b)(1) of the Act, 21 U.S.C. section 360bbb-3(b)(1), unless the authorization is terminated or revoked.  Performed at [facility], [address] [place], [place] [address]      Signed: Lorin Glass  [facility] 05/20/2020, 5:31 PM

## 2020-05-22 ENCOUNTER — Encounter: Payer: Self-pay | Admitting: Neurology

## 2020-05-22 ENCOUNTER — Ambulatory Visit: Payer: 59 | Admitting: Neurology

## 2020-05-22 VITALS — BP 166/103 | HR 100 | Ht 71.0 in | Wt 322.0 lb

## 2020-05-22 DIAGNOSIS — G4719 Other hypersomnia: Secondary | ICD-10-CM

## 2020-05-22 DIAGNOSIS — J9601 Acute respiratory failure with hypoxia: Secondary | ICD-10-CM | POA: Diagnosis not present

## 2020-05-22 DIAGNOSIS — R351 Nocturia: Secondary | ICD-10-CM

## 2020-05-22 DIAGNOSIS — R29818 Other symptoms and signs involving the nervous system: Secondary | ICD-10-CM | POA: Diagnosis not present

## 2020-05-22 DIAGNOSIS — R0681 Apnea, not elsewhere classified: Secondary | ICD-10-CM | POA: Diagnosis not present

## 2020-05-22 DIAGNOSIS — Z820 Family history of epilepsy and other diseases of the nervous system: Secondary | ICD-10-CM

## 2020-05-22 NOTE — Patient Instructions (Signed)

## 2020-05-22 NOTE — Progress Notes (Addendum)
Subjective:    Patient ID: Donald Dixon is a 44 y.o. male.  HPI     Huston FoleySaima Makeda Peeks, MD, PhD [facility] [address], [id number] [place], [place] [id number]  I saw patient, Donald Dixon, has a referral from the emergency room for concern for sleep apnea.  The patient is unaccompanied today.  Donald Dixon is a 44 year old right-handed gentleman with an underlying medical history of gout, hypertension, and morbid obesity with a BMI of over 40, who was hospitalized recently on 05/19/2020 through the emergency room after an episode of decreased responsiveness and respiratory distress.  The patient had fallen out of bed at home in the early morning hours and was noted by his wife to struggle to breathe and he was cyanotic.  She started chest compressions and called 911.  He declined hospital evaluation at the time and did not get transported to the ED through EMS but later his wife took [facility].  His wife suspects that he has sleep apnea, he snores, he has excessive daytime somnolence and was noted to be hypoxic in the hospital requiring oxygen supplementation.  He was discharged with a diagnosis of acute hypoxic and hypercarbic respiratory failure with suspicion of underlying sleep apnea.  He was counseled on weight loss.   His Epworth sleepiness score is 10 out of 24, fatigue severity score is 15 out of 63.  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  He is in bed by 11 and rise time is around 5.  He is a non-smoker.  He drinks caffeine in the form of coffee, 1 or 2 cups/day and soda or tea with dinner.  He has a family history of sleep apnea, both parents and his brother are on CPAP machines.  He has no recurrent morning headaches and has nocturia about once or twice per average night.  He has had witnessed apneas per wife's report.  He has had weight gain over time.  He is currently working on weight loss.  He feels at baseline.   He lives at home with his wife and 2 children, 43-year-old son and 575-month-old daughter.  He works for the [employer].  His Past Medical History Is Significant For: Past Medical History:  Diagnosis Date  . Gout   . Hypertension     His Past Surgical History Is Significant For: No past surgical history on file.  His Family History Is Significant For: No family history on file.  His Social History Is Significant For: Social History   Socioeconomic History  . Marital status: Married    Spouse name: Not on file  . Number of children: Not on file  . Years of education: Not on file  . Highest education level: Not on file  Occupational History  . Not on file  Tobacco Use  . Smoking status: Never Smoker  . Smokeless tobacco: Never Used  Vaping Use  . Vaping Use: Never used  Substance and Sexual Activity  . Alcohol use: Never  . Drug use: Never  . Sexual activity: Not on file  Other Topics Concern  . Not on file  Social History Narrative  . Not on file   Social Determinants of Health   Financial Resource Strain:   . Difficulty of Paying Living Expenses: Not on file  Food Insecurity:   . Worried About Programme researcher, broadcasting/film/videounning Out of Food in the Last Year: Not on file  .  Ran Out of Food in the Last Year: Not on file  Transportation Needs:   . Lack of Transportation (Medical): Not on file  . Lack of Transportation (Non-Medical): Not on file  Physical Activity:   . Days of Exercise per Week: Not on file  . Minutes of Exercise per Session: Not on file  Stress:   . Feeling of Stress : Not on file  Social Connections:   . Frequency of Communication with Friends and Family: Not on file  . Frequency of Social Gatherings with Friends and Family: Not on file  . Attends Religious Services: Not on file  . Active Member of Clubs or Organizations: Not on file  . Attends Banker Meetings: Not on file  . Marital Status: Not on file    His Allergies Are:   No Known Allergies:   His Current Medications Are:  Outpatient Encounter Medications as of 05/22/2020  Medication Sig  . allopurinol (ZYLOPRIM) 100 MG tablet Take 100 mg by mouth daily.  [name] Kitchen lisinopril (ZESTRIL) 10 MG tablet Take 10 mg by mouth daily.    No facility-administered encounter medications on file as of 05/22/2020.  :  Review of Systems:  Out of a complete 14 point review of systems, all are reviewed and negative with the exception of these symptoms as listed below: Review of Systems  Neurological:       Pt presents today to discuss his sleep. Pt has had a sleep study about 9-10 years ago but it did not show enough osa to be started on cpap. Pt has gained weight since then. He does endorse snoring.  Epworth Sleepiness Scale 0= would never doze 1= slight chance of dozing 2= moderate chance of dozing 3= high chance of dozing  Sitting and reading: 2 Watching TV: 1 Sitting inactive in a public place (ex. Theater or meeting): 1 As a passenger in a car for an hour without a break: 2 Lying down to rest in the afternoon: 3 Sitting and talking to someone: 0 Sitting quietly after lunch (no alcohol): 1 In a car, while stopped in traffic: 0 Total: 10     Objective:  Neurological Exam  Physical Exam Physical Examination:   Vitals:   05/22/20 1019  BP: (!) 166/103  Pulse: 100   General Examination: The patient is a very pleasant 44 y.o. male in no acute distress. He appears well-developed and well-nourished and well groomed.   HEENT: Normocephalic, atraumatic, pupils are equal, round and reactive to light, extraocular tracking is good without limitation to gaze excursion or nystagmus noted. Hearing is grossly intact. Face is symmetric with normal facial animation. Speech is clear with no dysarthria noted. There is no hypophonia. There is no lip, neck/head, jaw or voice tremor. Neck is supple with full range of passive and active motion. There are no carotid bruits on  auscultation. Oropharynx exam reveals: mild mouth dryness, good dental hygiene and marked airway crowding, due to small airway entry, thicker soft palate, larger uvula which appears to be mildly irritated and mildly swollen, tonsillar size of about 2+.  Mallampati class III.  Neck circumference of 20 three-quarter inches.  He has a minimal overbite.  Tongue protrudes centrally and palate elevates symmetrically.  Chest: Clear to auscultation without wheezing, rhonchi or crackles noted.  Heart: S1+S2+0, regular and normal without murmurs, rubs or gallops noted.   Abdomen: Soft, non-tender and non-distended with normal bowel sounds appreciated on auscultation.  Extremities: There is no pitting edema in the  distal lower extremities bilaterally.   Skin: Warm and dry without trophic changes noted.   Musculoskeletal: exam reveals no obvious joint deformities, tenderness or joint swelling or erythema.   Neurologically:  Mental status: The patient is awake, alert and oriented in all 4 spheres. His immediate and remote memory, attention, language skills and fund of knowledge are appropriate. There is no evidence of aphasia, agnosia, apraxia or anomia. Speech is clear with normal prosody and enunciation. Thought process is linear. Mood is normal and affect is normal.  Cranial nerves II - XII are as described above under HEENT exam.  Motor exam: Normal bulk, strength and tone is noted. There is no tremor, Romberg is negative. Reflexes are 2+ throughout. Fine motor skills and coordination: grossly intact.  Cerebellar testing: No dysmetria or intention tremor. There is no truncal or gait ataxia.  Sensory exam: intact to light touch in the upper and lower extremities.  Gait, station and balance: He stands easily. No veering to one side is noted. No leaning to one side is noted. Posture is age-appropriate and stance is narrow based. Gait shows normal stride length and normal pace. No problems turning are noted.  Tandem walk is unremarkable.                Assessment and Plan:  In summary, Shamond Skelton is a very pleasant 44 y.o.-year old male with an underlying medical history of gout, hypertension, and morbid obesity with a BMI of over 40, who presents for evaluation of his sleep disordered breathing, likely underlying sleep apnea.  He was recently hospitalized with acute hypoxic, hypercapnic respiratory failure.  He feels back to baseline, history and examination are highly concerning for underlying obstructive sleep apnea (OSA).   I had a long chat with the patient about my findings and the diagnosis of OSA, its prognosis and treatment options. We talked about medical treatments, surgical interventions and non-pharmacological approaches. I explained in particular the risks and ramifications of untreated moderate to severe OSA, especially with respect to developing cardiovascular disease down the Road, including congestive heart failure, difficult to treat hypertension, cardiac arrhythmias, or stroke. Even type 2 diabetes has, in part, been linked to untreated OSA. Symptoms of untreated OSA include daytime sleepiness, memory problems, mood irritability and mood disorder such as depression and anxiety, lack of energy, as well as recurrent headaches, especially morning headaches. We talked about trying to maintain a healthy lifestyle in general, as well as the importance of weight control. We also talked about the importance of good sleep hygiene. I recommended the following at this time: sleep study.  I explained the sleep test procedure to the patient and also outlined possible surgical and non-surgical treatment options of OSA, including the use of a custom-made dental device (which would require a referral to a specialist dentist or oral surgeon), upper airway surgical options, such as traditional UPPP or a novel less invasive surgical option in the form of Inspire hypoglossal nerve stimulation (which would  involve a referral to an ENT surgeon). I also explained the CPAP treatment option to the patient, who indicated that he would be willing to try CPAP if the need arises. I explained the importance of being compliant with PAP treatment, not only for insurance purposes but primarily to improve His symptoms, and for the patient's long term health benefit, including to reduce His cardiovascular risks. I answered all his questions today and the patient was in agreement. I plan to see him back after the sleep study  is completed and encouraged him to call with any interim questions, concerns, problems or updates.   Huston Foley, MD, PhD

## 2020-05-31 ENCOUNTER — Encounter: Payer: Self-pay | Admitting: Neurology

## 2020-06-18 ENCOUNTER — Other Ambulatory Visit: Payer: Self-pay

## 2020-06-18 ENCOUNTER — Ambulatory Visit (INDEPENDENT_AMBULATORY_CARE_PROVIDER_SITE_OTHER): Payer: 59 | Admitting: Neurology

## 2020-06-18 DIAGNOSIS — G4720 Circadian rhythm sleep disorder, unspecified type: Secondary | ICD-10-CM

## 2020-06-18 DIAGNOSIS — R351 Nocturia: Secondary | ICD-10-CM

## 2020-06-18 DIAGNOSIS — J9601 Acute respiratory failure with hypoxia: Secondary | ICD-10-CM

## 2020-06-18 DIAGNOSIS — R29818 Other symptoms and signs involving the nervous system: Secondary | ICD-10-CM

## 2020-06-18 DIAGNOSIS — G4734 Idiopathic sleep related nonobstructive alveolar hypoventilation: Secondary | ICD-10-CM

## 2020-06-18 DIAGNOSIS — G4719 Other hypersomnia: Secondary | ICD-10-CM

## 2020-06-18 DIAGNOSIS — Z820 Family history of epilepsy and other diseases of the nervous system: Secondary | ICD-10-CM

## 2020-06-18 DIAGNOSIS — G4733 Obstructive sleep apnea (adult) (pediatric): Secondary | ICD-10-CM

## 2020-06-18 DIAGNOSIS — R0681 Apnea, not elsewhere classified: Secondary | ICD-10-CM

## 2020-06-19 NOTE — Progress Notes (Signed)
The patient was referred by the hospital for suspected sleep apnea.  I saw him on 05/22/2020.  He had a split-night sleep study on 06/18/2020 which confirmed rather severe obstructive sleep apnea.  He had prolonged and severe desaturations.  He responded quite well on BiPAP therapy and I would like to get him set up as soon as possible as a urgency on home BiPAP therapy.  Please help expedite set up as best as possible, talk to several different DME companies if needed.  He will need a follow-up appointment in about 60 to 89 days after set up. Please advise patient to continue to work aggressively on weight loss as it likely will help over time to reduce his severe sleep apnea.  If he would like a referral to medical weight management, we can place a referral to healthy weight and wellness if he is agreeable.

## 2020-06-19 NOTE — Procedures (Signed)
PATIENT'S NAME:  Donald Dixon, Donald Dixon DOB:      February 14, 1976      MR#:    [id number]     DATE OF RECORDING: 06/18/2020 REFERRING M.D.:  ER referral Study Performed:  Split-Night Titration Study HISTORY: 44 year old man with a history of gout, hypertension, and morbid obesity with a BMI of over 40, who was hospitalized recently on 05/19/2020 through the emergency room after an episode of decreased responsiveness and respiratory distress.  He was discharged with a diagnosis of acute hypoxic and hypercarbic respiratory failure with suspicion of underlying sleep apnea. The patient endorsed the Epworth Sleepiness Scale at 10 points. The patient's weight 322 pounds with a height of 71 (inches), resulting in a BMI of 45.1 kg/m2. The patient's neck circumference measured 20.8 inches.  CURRENT MEDICATIONS: Zyloprim, Zestril  PROCEDURE:  This is a multichannel digital polysomnogram utilizing the Somnostar 11.2 system.  Electrodes and sensors were applied and monitored per AASM Specifications.   EEG, EOG, Chin and Limb EMG, were sampled at 200 Hz.  ECG, Snore and Nasal Pressure, Thermal Airflow, Respiratory Effort, CPAP Flow and Pressure, Oximetry was sampled at 50 Hz. Digital video and audio were recorded.      BASELINE STUDY WITHOUT CPAP RESULTS:  Lights Out was at 21:57 and Lights On at 05:17 for the night.  Emergency split protocol was initiated and patient placed on Positive airway pressure (PAP) treatment at 22:58, epoch 129, based on the severity of his sleep disordered breathing including frequent obstructive respiratory events and severe and prolonged desaturations. Total recording time (TRT) was 76, with a total sleep time (TST) of 64 minutes.   The patient's sleep latency was 5.5 minutes.  REM sleep was absent prior to PAP initiation. The sleep efficiency was 84.2 %.    SLEEP ARCHITECTURE: WASO (Wake after sleep onset) was 0.5 minutes, Stage N1 was 3 minutes, Stage N2 was 60.5 minutes, Stage N3 was 0.5  minutes and Stage R (REM sleep) was 0 minutes.  The percentages were Stage N1 4.7%, Stage N2 94.5%, Stage N3 .8% and Stage R (REM sleep) was absent.   RESPIRATORY ANALYSIS:  There were a total of 118 respiratory events:  0 obstructive apneas, 0 central apneas and 0 mixed apneas with a total of 0 apneas and an apnea index (AI) of 0. There were 118 hypopneas with a hypopnea index of 110.6. The patient also had 0 respiratory event related arousals (RERAs).      The total APNEA/HYPOPNEA INDEX (AHI) was 110.6 /hour and the total RESPIRATORY DISTURBANCE INDEX was 110.6 /hour.  0 events occurred in REM sleep and 236 events in NREM. The REM AHI was 0 /hour versus a non-REM AHI of 110.6 /hour. The patient spent 0 minutes sleep time in the supine position 388 minutes in non-supine. The supine AHI was 0.0 /hour versus a non-supine AHI of 110.6 /hour.  OXYGEN SATURATION & C02:  The wake baseline 02 saturation was 90%, with the lowest being 72%. Time spent below 89% saturation equaled 61 minutes. After desaturating into the 70s, he was noted to have poor recovery of his oxygen saturations, he would go into the 80s but not going to the 90s in between respiratory episodes.  His persistent level of hypoxemia coupled with very frequent respiratory events led to the implementation of emergency split protocol.  PERIODIC LIMB MOVEMENTS: The patient had a total of 0 Periodic Limb Movements.  The Periodic Limb Movement (PLM) index was 0 /hour and the PLM Arousal index was  0 /hour.   Audio and video analysis did not show any abnormal or unusual movements, behaviors, phonations or vocalizations. The patient took 1 bathroom break. Snoring was not very large but had a high-pitched constricted, almost stridorous sound to it at times. The EKG was in keeping with normal sinus rhythm (NSR)  TITRATION STUDY WITH CPAP RESULTS:   The patient was fitted with a nasal pillows interface but quickly changed to a ResMed F 30 fullface mask  size medium.  He was started on CPAP 6 cm which was advanced in 1 to 2 cm increments to 13 cm.  He required higher pressures and was therefore switched to standard BiPAP therapy of 14/10 centimeters and further titrated to a final pressure of 18/14 centimeters, at which point his AHI was 1.4/h, O2 nadir 88% with non-supine non-REM sleep achieved.  Non-supine REM sleep was achieved on a pressure of 18/13 with O2 nadir of 87%, AHI 9.7/h.  Total recording time (TRT) was 365 minutes, with a total sleep time (TST) of 324 minutes. The patient's sleep latency was 22.5 minutes. REM latency was 17.5 minutes, in keeping with rebound. The sleep efficiency was 88.8 %.    SLEEP ARCHITECTURE: Wake after sleep was 12.5 minutes, Stage N1 21.5 minutes, Stage N2 176 minutes, Stage N3 12 minutes and Stage R (REM sleep) 114.5 minutes. The percentages were: Stage N1 6.6%, Stage N2 54.3%, Stage N3 3.7% and Stage R (REM sleep) 35.3%, in keeping with rebound.   The arousals were noted as: 13 were spontaneous, 0 were associated with PLMs, 17 were associated with respiratory events.  RESPIRATORY ANALYSIS:  There were a total of 70 respiratory events: 0 obstructive apneas, 0 central apneas and 0 mixed apneas with a total of 0 apneas and an apnea index (AI) of 0. There were 70 hypopneas with a hypopnea index of 13. /hour. The patient also had 0 respiratory event related arousals (RERAs).      The total APNEA/HYPOPNEA INDEX  (AHI) was 13. /hour and the total RESPIRATORY DISTURBANCE INDEX was 13. /hour.  44 events occurred in REM sleep and 26 events in NREM. The REM AHI was 23.1 /hour versus a non-REM AHI of 7.4 /hour. The supine AHI was 0.0 /hour, versus a non-supine AHI of 13.0/hour.  OXYGEN SATURATION & C02:  The wake baseline 02 saturation was 90%, with the lowest being 73%. Time spent below 89% saturation equaled 10 minutes.  PERIODIC LIMB MOVEMENTS:    The patient had a total of 0 Periodic Limb Movements. The Periodic Limb  Movement (PLM) index was 0 /hour and the PLM Arousal index was 0 /hour. Post-study, the patient indicated that sleep was better than usual.  POLYSOMNOGRAPHY IMPRESSION :   1. Severe Obstructive Sleep Apnea (OSA)  2. Nocturnal hypoxemia 3. Dysfunctions associated with sleep stages or arousals from sleep  RECOMMENDATIONS:  1. This patient has severe obstructive sleep apnea with critical desaturations and evidence of longer periods of desaturations, with poor recovery in between respiratory events, and constrained, stridorous breathing. He responded remarkably well to standard BiPAP therapy, required no supplemental oxygen He did not have sufficient response to CPAP therapy alone, requiring higher pressure settings. I will recommend that the patient start as soon as possible on home BiPAP therapy at a pressure of 18/14 centimeters via medium FFM. He will be advised to be fully compliant with his BiPAP treatment and is urged to work aggressively on weight loss as it can, over time, help reduce the severity of  his obstructive sleep disordered breathing. The patient should be reminded to be fully compliant with PAP therapy to improve sleep related symptoms and decrease long term cardiovascular risks. Please note that untreated obstructive sleep apnea may carry additional perioperative morbidity. Patients with significant obstructive sleep apnea should receive perioperative PAP therapy and the surgeons and particularly the anesthesiologist should be informed of the diagnosis and the severity of the sleep disordered breathing. 2. This study shows sleep fragmentation and abnormal sleep stage percentages; these are nonspecific findings and per se do not signify an intrinsic sleep disorder or a cause for the patient's sleep-related symptoms. Causes include (but are not limited to) the first night effect of the sleep study, circadian rhythm disturbances, medication effect or an underlying mood disorder or medical  problem.  3. The patient should be cautioned not to drive, work at heights, or operate dangerous or heavy equipment when tired or sleepy. Review and reiteration of good sleep hygiene measures should be pursued with any patient. 4. This study shows some sleep fragmentation and abnormal sleep stage percentages; these are nonspecific findings and per se do not signify an intrinsic sleep disorder or a cause for the patient's sleep-related symptoms. Causes include (but are not limited to) the first night effect of the sleep study, circadian rhythm disturbances, medication effect or an underlying mood disorder or medical problem.  5. The patient will be seen in follow-up in the sleep clinic at [facility] for discussion of the test results, symptom and treatment compliance review, further management strategies, etc. The referring provider will be notified of the test results.  I certify that I have reviewed the entire raw data recording prior to the issuance of this report in accordance with the Standards of Accreditation of the American Academy of Sleep Medicine (AASM)   Huston Foley, MD, PhD Diplomat, American Board of Neurology and Sleep Medicine (Neurology and Sleep Medicine)

## 2020-06-19 NOTE — Addendum Note (Signed)
Addended by: Huston Foley on: 06/19/2020 08:54 AM   Modules accepted: Orders

## 2020-06-24 ENCOUNTER — Telehealth: Payer: Self-pay

## 2020-06-24 NOTE — Telephone Encounter (Signed)
-----   Message from Huston Foley, MD sent at 06/19/2020  8:50 AM EST ----- The patient was referred by the hospital for suspected sleep apnea.  I saw him on 05/22/2020.  He had a split-night sleep study on 06/18/2020 which confirmed rather severe obstructive sleep apnea.  He had prolonged and severe desaturations.  He responded quite well on BiPAP therapy and I would like to get him set up as soon as possible as a urgency on home BiPAP therapy.  Please help expedite set up as best as possible, talk to several different DME companies if needed.  He will need a follow-up appointment in about 60 to 89 days after set up. Please advise patient to continue to work aggressively on weight loss as it likely will help over time to reduce his severe sleep apnea.  If he would like a referral to medical weight management, we can place a referral to healthy weight and wellness if he is agreeable.

## 2020-06-24 NOTE — Telephone Encounter (Signed)
I called [employer] Dixon. I advised Donald Dixon that Dr. Frances Furbish reviewed their sleep study results and found that Donald Dixon has severe osa. Dr. Frances Furbish recommends that Donald Dixon start a bipap. I reviewed PAP compliance expectations with the Donald Dixon. Donald Dixon is agreeable to starting a BiPAP. I advised Donald Dixon that an order will be sent to a DME, [facility], and [facility] will call the Donald Dixon within about one week after they file with the Donald Dixon's insurance. [facility] will show the [employer] how to use the machine, fit for masks, and troubleshoot the BiPAP if needed. A follow up appt was made for insurance purposes with Dr. Frances Furbish on 09/26/2020 at 8:30am. Donald Dixon verbalized understanding to arrive 15 minutes early and bring their BiPAP. A letter with all of this information in it will be mailed to the [employer] as a reminder. I verified with the Donald [employer] that the address we have on file is correct. Donald Dixon verbalized understanding of results. Donald Dixon had no questions at this time but was encouraged to call back if questions arise. I have sent the order to [facility] and have received confirmation that they have received the order.  Donald Dixon would like a referral to [facility]. I will order this.  I have contacted [facility] and ask that this be marked as an urgent order.

## 2020-06-25 ENCOUNTER — Institutional Professional Consult (permissible substitution): Payer: 59 | Admitting: Neurology

## 2020-09-26 ENCOUNTER — Encounter: Payer: Self-pay | Admitting: Neurology

## 2020-09-26 ENCOUNTER — Ambulatory Visit: Payer: 59 | Admitting: Neurology

## 2020-09-26 ENCOUNTER — Telehealth: Payer: Self-pay

## 2020-09-26 VITALS — BP 145/95 | HR 76 | Ht 72.0 in | Wt 318.0 lb

## 2020-09-26 DIAGNOSIS — G4733 Obstructive sleep apnea (adult) (pediatric): Secondary | ICD-10-CM

## 2020-09-26 NOTE — Telephone Encounter (Signed)
Patient needed mask fitting for his cpap machine. He is currently on a full face mask and feels he keeps his mouth closed at night. He would like to try a nasal mask for comfort. I fitted him with ResMed N30 size large. It fit him very well. He loved the comfort of this mask. With his beard this will help with any leaks he may be having. I sent mask home with him and instructed to call his DME company and let them know he would like to change to this mask when its time for a new one. He was very Adult nurse.

## 2020-09-26 NOTE — Progress Notes (Signed)
Subjective:    Patient ID: Donald Dixon is a 45 y.o. male.  HPI     Interim history:   Donald Dixon is a 45 year old right-handed gentleman with an underlying medical history of gout, hypertension, and morbid obesity with a BMI of over 40, who presents for follow-up consultation of his severe obstructive sleep apnea after undergoing a split-night sleep study.  The patient is unaccompanied today.  I first met him on 05/22/2020 as a referral from the emergency room/hospital, as he was hospitalized for acute respiratory failure in the context of obstructive sleep apnea.  He was advised to proceed with a sleep study.  He had a split-night sleep study on 06/18/2020 which showed severe obstructive sleep apnea with an AHI at baseline of 110.6/h and severe as well as prolonged desaturations noted.  Emergency split-night protocol was initiated close to 11 PM after about 2 hours of test time.  Sleep latency was 5.5 minutes, REM sleep was absent prior to positive airway pressure initiation.  He had snoring and also high-pitched constricted breathing sounds.  He was fitted with a full facemask and started on CPAP of 6 cm which was advanced to 13 cm.  Due to higher pressure requirements he was switched to standard BiPAP therapy of 14/10 centimeters and further titrated to a final pressure of 18/14 centimeters.  AHI at that point was 1.4/h, O2 nadir 88% with nonsupine and non-REM sleep achieved.  REM sleep was achieved on a pressure of 18/13 with an oxygen nadir of 87 and AHI of 9.7/h.  He was advised to start home BiPAP therapy at a pressure of 18/14 centimeters.  His set up date was 07/03/2020.   Today, 09/26/2020: I reviewed his BiPAP compliance data from 08/27/2020 through 09/25/2020, which is a total of 30 days, during which time he used his machine every night with percent use days greater than 4 hours at 90%, indicating excellent compliance with an average usage of 5 hours and 49 minutes, residual AHI at goal  at 1.1/h, leak acceptable with a 95th percentile at 18 L/min on a pressure of 18/14.  He reports doing very well.  He has adjusted to treatment, mask leaks from time to time, he tried a traditionally shaped fullface mask but it was leaking around the nose.  He has an F 30 fullface mask and it does leak from time to time and then he tightens the headgear which is uncomfortable.  His leak is indeed fluctuating.  He would like to see about trying a nose mask.  He believes that he can keep his mouth closed fairly well at night.  His wife has given great feedback, he is sleeping really well.  He feels much better, daytime energy is better, he is sleeping much more soundly and with good quality sleep.  The days that he was not able to use it more than 4 hours he was likely on call.  He is very pleased with his outcome thus far.  He has an appointment scheduled with the healthy weight and wellness clinic as well.  He is very motivated to work on weight loss and continue with his BiPAP.  The patient's allergies, current medications, family history, past medical history, past social history, past surgical history and problem list were reviewed and updated as appropriate.   Previously:   05/22/20: (He) was hospitalized recently on 05/19/2020 through the emergency room after an episode of decreased responsiveness and respiratory distress.  The patient had fallen out of bed  at home in the early morning hours and was noted by his wife to struggle to breathe and he was cyanotic.  She started chest compressions and called 911.  He declined hospital evaluation at the time and did not get transported to the ED through EMS but later his wife took him to the emergency room at [facility].  His wife suspects that he has sleep apnea, he snores, he has excessive daytime somnolence and was noted to be hypoxic in the hospital requiring oxygen supplementation.  He was discharged with a diagnosis of acute hypoxic and  hypercarbic respiratory failure with suspicion of underlying sleep apnea.  He was counseled on weight loss.   His Epworth sleepiness score is 10 out of 24, fatigue severity score is 15 out of 63.  He works as a major Designer, industrial/product.  He is in bed by 11 and rise time is around 5.  He is a non-smoker.  He drinks caffeine in the form of coffee, 1 or 2 cups/day and soda or tea with dinner.  He has a family history of sleep apnea, both parents and his brother are on CPAP machines.  He has no recurrent morning headaches and has nocturia about once or twice per average night.  He has had witnessed apneas per wife's report.  He has had weight gain over time.  He is currently working on weight loss.  He feels at baseline.  He lives at home with his wife and 2 children, 31-year-old son and 80-monthold daughter.  He works for the [employer]    His Past Medical History Is Significant For: Past Medical History:  Diagnosis Date  . Gout   . Hypertension     His Past Surgical History Is Significant For: No past surgical history on file.  His Family History Is Significant For: No family history on file.  His Social History Is Significant For: Social History   Socioeconomic History  . Marital status: Married    Spouse name: Not on file  . Number of children: Not on file  . Years of education: Not on file  . Highest education level: Not on file  Occupational History  . Not on file  Tobacco Use  . Smoking status: Never Smoker  . Smokeless tobacco: Never Used  Vaping Use  . Vaping Use: Never used  Substance and Sexual Activity  . Alcohol use: Never  . Drug use: Never  . Sexual activity: Not on file  Other Topics Concern  . Not on file  Social History Narrative   ** Merged History Encounter **       Social Determinants of Health   Financial Resource Strain: Not on file  Food Insecurity: Not on file  Transportation Needs: Not on file  Physical Activity: Not on  file  Stress: Not on file  Social Connections: Not on file    His Allergies Are:  No Known Allergies:   His Current Medications Are:  Outpatient Encounter Medications as of 09/26/2020  Medication Sig  . allopurinol (ZYLOPRIM) 100 MG tablet Take 100 mg by mouth daily.  .[name] Kitchenlisinopril (ZESTRIL) 10 MG tablet Take 10 mg by mouth daily.    No facility-administered encounter medications on file as of 09/26/2020.  :  Review of Systems:  Out of a complete 14 point review of systems, all are reviewed and negative with the exception of these symptoms as listed below: Review of Systems  Neurological:  Here for consult on bipap- reports he has been doing well on the machine. No complaints with the machine. Would like to discuss a different mask option.     Objective:  Neurological Exam  Physical Exam Physical Examination:   Vitals:   09/26/20 0835  BP: (!) 145/95  Pulse: 76    General Examination: The patient is a very pleasant 45 y.o. male in no acute distress. He appears well-developed and well-nourished and well groomed.   HEENT: Normocephalic, atraumatic, pupils are equal, round and reactive to light, extraocular tracking is well-preserved, hearing grossly intact, face is symmetric with normal facial animation.  Speech is clear without dysarthria, hypophonia or voice tremor.  Airway examination reveals mild to moderate mouth dryness, otherwise mostly stable findings.  Tongue protrudes centrally in palate elevates symmetrically, uvula does appear to be less irritated and swollen today.  Neck is supple, no carotid bruits.  Chest: Clear to auscultation without wheezing, rhonchi or crackles noted.  Heart: S1+S2+0, regular and normal without murmurs, rubs or gallops noted.   Abdomen: Soft, non-tender and non-distended.  Extremities: There is no pitting edema in the distal lower extremities bilaterally.   Skin: Warm and dry without trophic changes noted.   Musculoskeletal:  exam reveals no obvious joint deformities, tenderness or joint swelling  Neurologically:  Mental status: The patient is awake, alert and oriented in all 4 spheres. His immediate and remote memory, attention, language skills and fund of knowledge are appropriate. There is no evidence of aphasia, agnosia, apraxia or anomia. Speech is clear with normal prosody and enunciation. Thought process is linear. Mood is normal and affect is normal.  Cranial nerves II - XII are as described above under HEENT exam.  Motor exam: Normal bulk, strength and tone is noted. There is no tremor, Romberg is negative. Fine motor skills and coordination: grossly intact.  Cerebellar testing: No dysmetria or intention tremor. There is no truncal or gait ataxia.  Sensory exam: intact to light touch in the upper and lower extremities.  Gait, station and balance: He stands easily. No veering to one side is noted. No leaning to one side is noted. Posture is age-appropriate and stance is narrow based. Gait shows normal stride length and normal pace. No problems turning are noted. Tandem walk is unremarkable.                Assessment and Plan:  In summary, Donald Dixon is a very pleasant 45 year old male with an underlying medical history of gout, hypertension, and morbid obesity with a BMI of over 40, who presents for follow-up consultation of his severe obstructive sleep apnea.  He had a sleep study on 06/18/2020 with emergency split protocol.  His baseline AHI was 110.6/h, O2 nadir 72% with prolonged desaturations and not full recovery of his oxygen saturations in between respiratory events.  He did very well with BiPAP therapy.  He has been on BiPAP for nearly 3 months and is fully compliant with treatment.  He endorses great improvement and very good results.  He is highly motivated to continue with treatment.  He is also working on weight loss and has an appointment with the medical weight management clinic coming up.  He is  commended for his treatment adherence and advised to try a nasal mask, we will try to give him a sample today after any mask fit in this office.  If he likes this mask, we can certainly prescribe this on an ongoing basis through his DME company.  He is advised to follow-up in this clinic routinely in 6 months to see one of our nurse practitioners and hopefully if he continues to do well he can be seen on a yearly basis thereafter.  I answered all his questions today and we reviewed his sleep study results in detail as well as his compliance data today.  He was in agreement with the plan.  I spent 30 minutes in total face-to-face time and in reviewing records during pre-charting, more than 50% of which was spent in counseling and coordination of care, reviewing test results, reviewing medications and treatment regimen and/or in discussing or reviewing the diagnosis of OSA, the prognosis and treatment options. Pertinent laboratory and imaging test results that were available during this visit with the patient were reviewed by me and considered in my medical decision making (see chart for details).

## 2020-09-26 NOTE — Patient Instructions (Signed)
It was good to see you again today.  You have done beautifully with your BiPAP and you are compliant with treatment.  I am glad to hear that you are feeling better as well.  As discussed, we will see if you can try a nasal mask, we will try to give you a sample today.  If you like it or if you want to try a different nose mask you can always talk to your DME provider and we can prescribe the style and size you like.  If you like this particular kind that we are able to give you today, you can let them know as well.    Please continue to work on weight loss.  I think you will be very pleased with the weight management clinic.  Please follow-up routinely with one of our nurse practitioners in 6 months and then hopefully yearly thereafter.  Please continue using your BiPAP regularly. While your insurance requires that you use BiPAP at least 4 hours each night on 70% of the nights, I recommend, that you not skip any nights and use it throughout the night if you can. Getting used to BiPAP and staying with the treatment long term does take time and patience and discipline. Untreated obstructive sleep apnea when it is moderate to severe can have an adverse impact on cardiovascular health and raise her risk for heart disease, arrhythmias, hypertension, congestive heart failure, stroke and diabetes. Untreated obstructive sleep apnea causes sleep disruption, nonrestorative sleep, and sleep deprivation. This can have an impact on your day to day functioning and cause daytime sleepiness and impairment of cognitive function, memory loss, mood disturbance, and problems focussing. Using BiPAP regularly can improve these symptoms.

## 2020-10-01 ENCOUNTER — Ambulatory Visit (INDEPENDENT_AMBULATORY_CARE_PROVIDER_SITE_OTHER): Payer: 59 | Admitting: Family Medicine

## 2020-10-02 ENCOUNTER — Ambulatory Visit (INDEPENDENT_AMBULATORY_CARE_PROVIDER_SITE_OTHER): Payer: 59 | Admitting: Family Medicine

## 2020-10-02 ENCOUNTER — Other Ambulatory Visit: Payer: Self-pay

## 2020-10-02 ENCOUNTER — Encounter (INDEPENDENT_AMBULATORY_CARE_PROVIDER_SITE_OTHER): Payer: Self-pay | Admitting: Family Medicine

## 2020-10-02 VITALS — BP 116/78 | HR 69 | Temp 98.4°F | Ht 70.0 in | Wt 315.0 lb

## 2020-10-02 DIAGNOSIS — G4733 Obstructive sleep apnea (adult) (pediatric): Secondary | ICD-10-CM

## 2020-10-02 DIAGNOSIS — I10 Essential (primary) hypertension: Secondary | ICD-10-CM | POA: Diagnosis not present

## 2020-10-02 DIAGNOSIS — Z0289 Encounter for other administrative examinations: Secondary | ICD-10-CM

## 2020-10-02 DIAGNOSIS — Z1331 Encounter for screening for depression: Secondary | ICD-10-CM

## 2020-10-02 DIAGNOSIS — R5383 Other fatigue: Secondary | ICD-10-CM

## 2020-10-02 DIAGNOSIS — Z6841 Body Mass Index (BMI) 40.0 and over, adult: Secondary | ICD-10-CM

## 2020-10-02 DIAGNOSIS — R0602 Shortness of breath: Secondary | ICD-10-CM

## 2020-10-02 DIAGNOSIS — Z9189 Other specified personal risk factors, not elsewhere classified: Secondary | ICD-10-CM

## 2020-10-03 LAB — T3: T3, Total: 169 ng/dL (ref 71–180)

## 2020-10-03 LAB — CBC WITH DIFFERENTIAL
Basophils Absolute: 0 10*3/uL (ref 0.0–0.2)
Basos: 1 %
EOS (ABSOLUTE): 0.1 10*3/uL (ref 0.0–0.4)
Eos: 2 %
Hematocrit: 44.3 % (ref 37.5–51.0)
Hemoglobin: 14.8 g/dL (ref 13.0–17.7)
Immature Grans (Abs): 0 10*3/uL (ref 0.0–0.1)
Immature Granulocytes: 1 %
Lymphocytes Absolute: 2.1 10*3/uL (ref 0.7–3.1)
Lymphs: 38 %
MCH: 28.3 pg (ref 26.6–33.0)
MCHC: 33.4 g/dL (ref 31.5–35.7)
MCV: 85 fL (ref 79–97)
Monocytes Absolute: 0.3 10*3/uL (ref 0.1–0.9)
Monocytes: 6 %
Neutrophils Absolute: 2.9 10*3/uL (ref 1.4–7.0)
Neutrophils: 52 %
RBC: 5.23 x10E6/uL (ref 4.14–5.80)
RDW: 14.2 % (ref 11.6–15.4)
WBC: 5.4 10*3/uL (ref 3.4–10.8)

## 2020-10-03 LAB — COMPREHENSIVE METABOLIC PANEL
ALT: 70 IU/L — ABNORMAL HIGH (ref 0–44)
AST: 43 IU/L — ABNORMAL HIGH (ref 0–40)
Albumin/Globulin Ratio: 1.8 (ref 1.2–2.2)
Albumin: 4.7 g/dL (ref 4.0–5.0)
Alkaline Phosphatase: 85 IU/L (ref 44–121)
BUN/Creatinine Ratio: 16 (ref 9–20)
BUN: 14 mg/dL (ref 6–24)
Bilirubin Total: 0.4 mg/dL (ref 0.0–1.2)
CO2: 22 mmol/L (ref 20–29)
Calcium: 9.6 mg/dL (ref 8.7–10.2)
Chloride: 103 mmol/L (ref 96–106)
Creatinine, Ser: 0.86 mg/dL (ref 0.76–1.27)
Globulin, Total: 2.6 g/dL (ref 1.5–4.5)
Glucose: 85 mg/dL (ref 65–99)
Potassium: 4 mmol/L (ref 3.5–5.2)
Sodium: 142 mmol/L (ref 134–144)
Total Protein: 7.3 g/dL (ref 6.0–8.5)
eGFR: 110 mL/min/{1.73_m2} (ref >59)

## 2020-10-03 LAB — LIPID PANEL WITH LDL/HDL RATIO
Cholesterol, Total: 151 mg/dL (ref 100–199)
HDL: 36 mg/dL — ABNORMAL LOW (ref >39)
LDL Chol Calc (NIH): 93 mg/dL (ref 0–99)
LDL/HDL Ratio: 2.6 ratio (ref 0.0–3.6)
Triglycerides: 123 mg/dL (ref 0–149)
VLDL Cholesterol Cal: 22 mg/dL (ref 5–40)

## 2020-10-03 LAB — TSH: TSH: 1.82 u[IU]/mL (ref 0.450–4.500)

## 2020-10-03 LAB — HEMOGLOBIN A1C
Est. average glucose Bld gHb Est-mCnc: 103 mg/dL
Hgb A1c MFr Bld: 5.2 % (ref 4.8–5.6)

## 2020-10-03 LAB — INSULIN, RANDOM: INSULIN: 37 u[IU]/mL — ABNORMAL HIGH (ref 2.6–24.9)

## 2020-10-03 LAB — FOLATE: Folate: 14 ng/mL (ref >3.0)

## 2020-10-03 LAB — VITAMIN B12: Vitamin B-12: 606 pg/mL (ref 232–1245)

## 2020-10-03 LAB — VITAMIN D 25 HYDROXY (VIT D DEFICIENCY, FRACTURES): Vit D, 25-Hydroxy: 9.1 ng/mL — ABNORMAL LOW (ref 30.0–100.0)

## 2020-10-03 LAB — T4: T4, Total: 9.3 ug/dL (ref 4.5–12.0)

## 2020-10-04 ENCOUNTER — Encounter: Payer: Self-pay | Admitting: Neurology

## 2020-10-08 NOTE — Progress Notes (Signed)
Dear Dr. Frances Dixon,   Thank you for referring Donald Dixon to our clinic. The following note includes my evaluation and treatment recommendations.  Chief Complaint:   OBESITY Donald Dixon (MR# [id number]) is a 45 y.o. male who presents for evaluation and treatment of obesity and related comorbidities. Current BMI is Body mass index is 45.2 kg/m. Donald Dixon has been struggling with his weight for many years and has been unsuccessful in either losing weight, maintaining weight loss, or reaching his healthy weight goal.  Donald Dixon is currently in the action stage of change and ready to dedicate time achieving and maintaining a healthier weight. Donald Dixon is interested in becoming our patient and working on intensive lifestyle modifications including (but not limited to) diet and exercise for weight loss.  Donald Dixon was referred by Dr. Frances Dixon. 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 Last time he was at desired weight was ~2010. Breakfast shake (equate) (no hunger); Lunch- prepackaged salad with chicken + water (2-3 days go out to lunch to [name]); Dinner- 2-3 cups spaghetti with meat sauce or 4.5 oz grilled chicken with potato + salad + bread or alfredo).  Donald Dixon's habits were reviewed today and are as follows: His family eats meals together, he thinks his family will eat healthier with him, his desired weight loss is 65 lbs, he has been heavy most of his life, he started gaining weight in 2016, his heaviest weight ever was 330 pounds, he skips meals frequently, he is frequently drinking liquids with calories, he frequently makes poor food choices, he frequently eats larger portions than normal and he struggles with emotional eating.  Depression Screen Donald Dixon Food and Mood (modified PHQ-9) score was 12.  Depression screen PHQ 2/9 10/02/2020  Decreased Interest 1  Down, Depressed, Hopeless 0  PHQ - 2 Score 1  Altered sleeping 3  Tired, decreased energy 3  Change in appetite 2   Feeling bad or failure about yourself  1  Trouble concentrating 2  Moving slowly or fidgety/restless 0  Suicidal thoughts 0  PHQ-9 Score 12  Difficult doing work/chores Not difficult at all   Subjective:   1. Other fatigue Donald Dixon denies daytime somnolence and denies waking up still tired. Patent has a history of symptoms of daytime fatigue. Donald Dixon generally gets 6 hours of sleep per night, and states that he has generally restful sleep. Snoring is present (before CPAP). Apneic episodes are present. Epworth Sleepiness Score is 4. EKG normal sinus rhythm at 61 bpm.  2. Shortness of breath Donald Dixon notes increasing shortness of breath with exercising and seems to be worsening over time with weight gain. He notes getting out of breath sooner with activity than he used to. This has gotten worse recently. Donald Dixon denies shortness of breath at rest or orthopnea. EKG normal sinus rhythm at 61 bpm.  3. Essential hypertension Donald Dixon is on lisinopril 10 mg. He was diagnosed 3-4 years ago. His BP is normally well controlled. Pt denies chest pain, chest pressure and headache.  BP Readings from Last 3 Encounters:  10/02/20 116/78  09/26/20 (!) 145/95  05/22/20 (!) 166/103   4. Obstructive sleep apnea Donald Dixon thinks he's had sleep apnea for a long time. He was diagnosed in November 2021. He now has a CPAP.  5. At risk for heart disease Donald Dixon is at a higher than average risk for cardiovascular disease due to obesity.   Assessment/Plan:   1. Other fatigue Donald Dixon does feel that his weight is causing his energy to  be lower than it should be. Fatigue may be related to obesity, depression or many other causes. Labs will be ordered, and in the meanwhile, [name] will focus on self care including making healthy food choices, increasing physical activity and focusing on stress reduction. Check labs today.  - EKG 12-Lead - Insulin, random - Hemoglobin A1c - Vitamin B12 - VITAMIN D 25  Hydroxy (Vit-D Deficiency, Fractures) - TSH - Folate - T3 - T4  2. Shortness of breath Donald Dixon does feel that he gets out of breath more easily that he used to when he exercises. Donald Dixon's shortness of breath appears to be obesity related and exercise induced. He has agreed to work on weight loss and gradually increase exercise to treat his exercise induced shortness of breath. Will continue to monitor closely. Check labs today. Follow up BP at next appointment.  - Lipid Panel With LDL/HDL Ratio - CBC With Differential  3. Essential hypertension Donald Dixon is working on healthy weight loss and exercise to improve blood pressure control. We will watch for signs of hypotension as he continues his lifestyle modifications. Check labs today.  - Comprehensive metabolic panel  4. Obstructive sleep apnea Intensive lifestyle modifications are the first line treatment for this issue. We discussed several lifestyle modifications today and he will continue to work on diet, exercise and weight loss efforts. We will continue to monitor. Orders and follow up as documented in patient record. Follow up with Dr. Frances Dixon.  5. Screening for depression Donald Dixon had a positive depression screening. Depression is commonly associated with obesity and often results in emotional eating behaviors. We will monitor this closely and work on CBT to help improve the non-hunger eating patterns. Referral to Psychology may be required if no improvement is seen as he continues in our clinic.  6. At risk for heart disease Donald Dixon was given approximately 15 minutes of coronary artery disease prevention counseling today. He is 45 y.o. male and has risk factors for heart disease including obesity. We discussed intensive lifestyle modifications today with an emphasis on specific weight loss instructions and strategies.   Repetitive spaced learning was employed today to elicit superior memory formation and behavioral change.  7.  Class 3 severe obesity with serious comorbidity and body mass index (BMI) of 45.0 to 49.9 in adult, unspecified obesity type Donald Dixon) Donald Dixon is currently in the action stage of change and his goal is to continue with weight loss efforts. I recommend Donald Dixon begin the structured treatment plan as follows:  He has agreed to the Category 4 Plan.  Exercise goals: No exercise has been prescribed at this time.   Behavioral modification strategies: increasing lean protein intake, meal planning and cooking strategies, keeping healthy foods in the home and planning for success.  He was informed of the importance of frequent follow-up visits to maximize his success with intensive lifestyle modifications for his multiple health conditions. He was informed we would discuss his lab results at his next visit unless there is a critical issue that needs to be addressed sooner. Donald Dixon agreed to keep his next visit at the agreed upon time to discuss these results.  Objective:   Blood pressure 116/78, pulse 69, temperature 98.4 F (36.9 C), height 5\' 10"  (1.778 m), weight (!) 315 lb (142.9 kg), SpO2 98 %. Body mass index is 45.2 kg/m.  EKG: Normal sinus rhythm, rate 61.  Indirect Calorimeter completed today shows a VO2 of 417 and a REE of 2904.  His calculated basal metabolic rate is 2905  thus his basal metabolic rate is better than expected.  General: Cooperative, alert, well developed, in no acute distress. HEENT: Conjunctivae and lids unremarkable. Cardiovascular: Regular rhythm.  Lungs: Normal work of breathing. Neurologic: No focal deficits.   Lab Results  Component Value Date   CREATININE 0.86 10/02/2020   BUN 14 10/02/2020   NA 142 10/02/2020   K 4.0 10/02/2020   CL 103 10/02/2020   CO2 22 10/02/2020   Lab Results  Component Value Date   ALT 70 (H) 10/02/2020   AST 43 (H) 10/02/2020   ALKPHOS 85 10/02/2020   BILITOT 0.4 10/02/2020   Lab Results  Component Value Date   HGBA1C 5.2  10/02/2020   Lab Results  Component Value Date   INSULIN 37.0 (H) 10/02/2020   Lab Results  Component Value Date   TSH 1.820 10/02/2020   Lab Results  Component Value Date   CHOL 151 10/02/2020   HDL 36 (L) 10/02/2020   LDLCALC 93 10/02/2020   TRIG 123 10/02/2020   Lab Results  Component Value Date   WBC 5.4 10/02/2020   HGB 14.8 10/02/2020   HCT 44.3 10/02/2020   MCV 85 10/02/2020   PLT 170 05/20/2020    Attestation Statements:   Reviewed by clinician on day of visit: allergies, medications, problem list, medical history, surgical history, family history, social history, and previous encounter notes.  Edmund Hilda, am acting as transcriptionist for Reuben Likes, MD.  I have reviewed the above documentation for accuracy and completeness, and I agree with the above. - Katherina Mires, MD

## 2020-10-16 ENCOUNTER — Ambulatory Visit (INDEPENDENT_AMBULATORY_CARE_PROVIDER_SITE_OTHER): Payer: 59 | Admitting: Family Medicine

## 2020-10-16 ENCOUNTER — Encounter (INDEPENDENT_AMBULATORY_CARE_PROVIDER_SITE_OTHER): Payer: Self-pay | Admitting: Family Medicine

## 2020-10-16 ENCOUNTER — Other Ambulatory Visit: Payer: Self-pay

## 2020-10-16 VITALS — BP 133/81 | HR 65 | Temp 98.3°F | Ht 70.0 in | Wt 301.0 lb

## 2020-10-16 DIAGNOSIS — Z9189 Other specified personal risk factors, not elsewhere classified: Secondary | ICD-10-CM

## 2020-10-16 DIAGNOSIS — E559 Vitamin D deficiency, unspecified: Secondary | ICD-10-CM

## 2020-10-16 DIAGNOSIS — R7401 Elevation of levels of liver transaminase levels: Secondary | ICD-10-CM

## 2020-10-16 DIAGNOSIS — E8881 Metabolic syndrome: Secondary | ICD-10-CM

## 2020-10-16 DIAGNOSIS — Z6841 Body Mass Index (BMI) 40.0 and over, adult: Secondary | ICD-10-CM

## 2020-10-16 MED ORDER — VITAMIN D (ERGOCALCIFEROL) 1.25 MG (50000 UNIT) PO CAPS: 50000.0000 [IU] | ORAL_CAPSULE | ORAL | 0 refills | Status: DC

## 2020-10-18 NOTE — Progress Notes (Signed)
Chief Complaint:   OBESITY Donald Dixon is here to discuss his progress with his obesity treatment plan along with follow-up of his obesity related diagnoses. Donald Dixon is on the Category 3 Plan and states he is following his eating plan approximately 80-85% of the time. Donald Dixon states he is walking 30 minutes 2 times per week.  Today's visit was #: 2 Starting weight: 315 lbs Starting date: 10/02/2020 Today's weight: 301 lbs Today's date: 10/16/2020 Total lbs lost to date: 14 Total lbs lost since last in-office visit: 14  Interim History: Donald Dixon stuck fairly significant to the meal plan over the last few weeks. He is enjoying meals, and his family has adjusted to meal plan. Sometimes on weekends, it was difficult following plan as pt was out and about running errands, etc. He is going to [employer] for a long weekend next weekend. Doing quite a bit of prepackaged 100 cal snacks.   Subjective:   1. Vitamin D deficiency New. Discussed labs with patient today. Donald Dixon has a Vit D level of 9.1. He reports fatigue. Pt is not on a Vit D supplement.  2. Insulin resistance New. Discussed labs with patient today. Donald Dixon has an A1c of 5.2 and insulin level of 37. He denies carb cravings.  3. Transaminitis Discussed labs with patient today. Donald Dixon had elevated LFT's even in July 2021. No ultrasound on file.  4. At risk for osteoporosis Donald Dixon is at higher risk of osteopenia and osteoporosis due to Vitamin D deficiency.   Assessment/Plan:   1. Vitamin D deficiency Low Vitamin D level contributes to fatigue and are associated with obesity, breast, and colon cancer. He agrees to start to take prescription Vitamin D @50 ,000 IU every week and will follow-up for routine testing of Vitamin D, at least 2-3 times per year to avoid over-replacement.  - Vitamin D, Ergocalciferol, (DRISDOL) 1.25 MG (50000 UNIT) CAPS capsule; Take 1 capsule (50,000 Units total) by mouth every 7 (seven) days.   Dispense: 4 capsule; Refill: 0  2. Insulin resistance Donald Dixon will continue to work on weight loss, exercise, and decreasing simple carbohydrates to help decrease the risk of diabetes. Donald Dixon agreed to follow-up with Donald Dixon as directed to closely monitor his progress. Repeat labs in 3 month.  3. Transaminitis Repeat CMP in 3 months.  4. At risk for osteoporosis Donald Dixon was given approximately 30 minutes of osteoporosis prevention counseling today. Donald Dixon is at risk for osteopenia and osteoporosis due to his Vitamin D deficiency. He was encouraged to take his Vitamin D and follow his higher calcium diet and increase strengthening exercise to help strengthen his bones and decrease his risk of osteopenia and osteoporosis.  Repetitive spaced learning was employed today to elicit superior memory formation and behavioral change.  5. Class 3 severe obesity with serious comorbidity and body mass index (BMI) of 45.0 to 49.9 in adult, unspecified obesity type [facility]) Donald Dixon is currently in the action stage of change. As such, his goal is to continue with weight loss efforts. He has agreed to the Category 4 Plan and keeping a food journal and adhering to recommended goals of 450-600 calories and 40+ protein for lunch and 550-700 calories and 50+ protein with dinner.   Exercise goals: No exercise has been prescribed at this time.  Behavioral modification strategies: increasing lean protein intake, meal planning and cooking strategies, keeping healthy foods in the home and travel eating strategies.  Donald Dixon has agreed to follow-up with our clinic in 2-3 weeks. He was informed of  the importance of frequent follow-up visits to maximize his success with intensive lifestyle modifications for his multiple health conditions.   Objective:   Blood pressure 133/81, pulse 65, temperature 98.3 F (36.8 C), height 5\' 10"  (1.778 m), weight (!) 301 lb (136.5 kg), SpO2 97 %. Body mass index is 43.19  kg/m.  General: Cooperative, alert, well developed, in no acute distress. HEENT: Conjunctivae and lids unremarkable. Cardiovascular: Regular rhythm.  Lungs: Normal work of breathing. Neurologic: No focal deficits.   Lab Results  Component Value Date   CREATININE 0.86 10/02/2020   BUN 14 10/02/2020   NA 142 10/02/2020   K 4.0 10/02/2020   CL 103 10/02/2020   CO2 22 10/02/2020   Lab Results  Component Value Date   ALT 70 (H) 10/02/2020   AST 43 (H) 10/02/2020   ALKPHOS 85 10/02/2020   BILITOT 0.4 10/02/2020   Lab Results  Component Value Date   HGBA1C 5.2 10/02/2020   Lab Results  Component Value Date   INSULIN 37.0 (H) 10/02/2020   Lab Results  Component Value Date   TSH 1.820 10/02/2020   Lab Results  Component Value Date   CHOL 151 10/02/2020   HDL 36 (L) 10/02/2020   LDLCALC 93 10/02/2020   TRIG 123 10/02/2020   Lab Results  Component Value Date   WBC 5.4 10/02/2020   HGB 14.8 10/02/2020   HCT 44.3 10/02/2020   MCV 85 10/02/2020   PLT 170 05/20/2020   No results found for: IRON, TIBC, FERRITIN   Attestation Statements:   Reviewed by clinician on day of visit: allergies, medications, problem list, medical history, surgical history, family history, social history, and previous encounter notes.  05/22/2020, am acting as transcriptionist for Edmund Hilda, MD.   I have reviewed the above documentation for accuracy and completeness, and I agree with the above. - Reuben Likes, MD

## 2020-10-30 ENCOUNTER — Ambulatory Visit (INDEPENDENT_AMBULATORY_CARE_PROVIDER_SITE_OTHER): Payer: 59 | Admitting: Family Medicine

## 2020-10-30 ENCOUNTER — Encounter (INDEPENDENT_AMBULATORY_CARE_PROVIDER_SITE_OTHER): Payer: Self-pay | Admitting: Family Medicine

## 2020-10-30 ENCOUNTER — Other Ambulatory Visit: Payer: Self-pay

## 2020-10-30 VITALS — BP 140/87 | HR 79 | Temp 98.5°F | Ht 70.0 in | Wt 305.0 lb

## 2020-10-30 DIAGNOSIS — E559 Vitamin D deficiency, unspecified: Secondary | ICD-10-CM | POA: Insufficient documentation

## 2020-10-30 DIAGNOSIS — Z9189 Other specified personal risk factors, not elsewhere classified: Secondary | ICD-10-CM

## 2020-10-30 DIAGNOSIS — I10 Essential (primary) hypertension: Secondary | ICD-10-CM

## 2020-10-30 DIAGNOSIS — Z6841 Body Mass Index (BMI) 40.0 and over, adult: Secondary | ICD-10-CM | POA: Diagnosis not present

## 2020-10-30 MED ORDER — VITAMIN D (ERGOCALCIFEROL) 1.25 MG (50000 UNIT) PO CAPS: 50000.0000 [IU] | ORAL_CAPSULE | ORAL | 0 refills | Status: DC

## 2020-11-07 NOTE — Progress Notes (Signed)
Chief Complaint:   OBESITY Donald Dixon is here to discuss his progress with his obesity treatment plan along with follow-up of his obesity related diagnoses.   Today's visit was #: 3 Starting weight: 315 lbs Starting date: 10/02/2020 Today's weight: 305 lbs Today's date: 10/30/2020 Weight change since last visit: +4 lbs Total lbs lost to date: 10 lbs Body mass index is 43.76 kg/m.  Total weight loss percentage to date: -3.17%  Interim History:  Donald Dixon was on vacation last week with family and ate off plan, but did Donald Dixon/PC.  He feels good about only gaining 4 pounds during vacation.  He got back in town and got back on plan.  No issues with plan.  Hunger and cravings are controlled.  Current Meal Plan: Category 4 Plan and keeping a food journal and adhering to recommended goals of 450-600 calories and 40+ protein for lunch and 550-700 calories and 50+ protein with dinner for 70% of the time.  Current Exercise Plan: Walking for 60 minutes 4 times per week.  Assessment/Plan:    Medications Discontinued During This Encounter  Medication Reason  . Vitamin D, Ergocalciferol, (DRISDOL) 1.25 MG (50000 UNIT) CAPS capsule Reorder     Meds ordered this encounter  Medications  . Vitamin D, Ergocalciferol, (DRISDOL) 1.25 MG (50000 UNIT) CAPS capsule    Sig: Take 1 capsule (50,000 Units total) by mouth every 7 (seven) days.    Dispense:  4 capsule    Refill:  0    1. Essential hypertension At goal. Medications: lisinopril 10 mg daily.  Occasionally checks blood pressure at home and it runs 120s/60s.  Asymptomatic.  Forgot blood pressure medication this morning.  Plan: Avoid buying foods that are: processed, frozen, or prepackaged to avoid excess salt. Ambulatory blood pressure monitoring was encouraged with a goal of at least 2-3 times weekly or when feeling poorly. Goal blood pressure is 130/80 or less on a regular basis.   BP Readings from Last 3 Encounters:  10/30/20 140/87  10/16/20  133/81  10/02/20 116/78   Lab Results  Component Value Date   CREATININE 0.86 10/02/2020   2. Vitamin D deficiency Not at goal. Current vitamin D is 9.1, tested on 10/02/2020. Optimal goal > 50 ng/dL.  She is taking vitamin D 50,000 IU weekly.  Plan: Continue to take prescription Vitamin D @50 ,000 IU every week as prescribed.  Follow-up for routine testing of Vitamin D, at least 2-3 times per year to avoid over-replacement.  - Refill Vitamin D, Ergocalciferol, (DRISDOL) 1.25 MG (50000 UNIT) CAPS capsule; Take 1 capsule (50,000 Units total) by mouth every 7 (seven) days.  Dispense: 4 capsule; Refill: 0  3. At risk for impaired metabolic function Due to Donald Dixon's current state of health and medical condition(s), he is at a significantly higher risk for impaired metabolic function.   At least 10 minutes was spent on counseling Donald Dixon about these concerns today.  This places the patient at a much greater risk to subsequently develop cardio-pulmonary conditions that can negatively affect the patient's quality of life.  I stressed the importance of reversing these risks factors.  The initial goal is to lose at least 5-10% of starting weight to help reduce risk factors.  Counseling:  Intensive lifestyle modifications discussed with Donald Dixon as the most appropriate first line treatment.  he will continue to work on diet, exercise, and weight loss efforts.  We will continue to reassess these conditions on a fairly regular basis in an attempt to  decrease the patient's overall morbidity and mortality.  4. Obesity, current BMI 43.9  Course: Donald Dixon is currently in the action stage of change. As such, his goal is to continue with weight loss efforts.   Nutrition goals: He has agreed to Category 4 Plan and keeping a food journal and adhering to recommended goals of 450-600 calories and 40+ protein for lunch and 550-700 calories and 50+ protein with dinner.   Exercise goals: As is.  Behavioral  modification strategies: meal planning and cooking strategies, planning for success and keeping a strict food journal.  Donald Dixon has agreed to follow-up with our clinic in 2 weeks with Dr. Lawson Radar or APP. He was informed of the importance of frequent follow-up visits to maximize his success with intensive lifestyle modifications for his multiple health conditions.   Objective:   Blood pressure 140/87, pulse 79, temperature 98.5 F (36.9 C), height 5\' 10"  (1.778 m), weight (!) 305 lb (138.3 kg), SpO2 97 %. Body mass index is 43.76 kg/m.  General: Cooperative, alert, well developed, in no acute distress. HEENT: Conjunctivae and lids unremarkable. Cardiovascular: Regular rhythm.  Lungs: Normal work of breathing. Neurologic: No focal deficits.   Lab Results  Component Value Date   CREATININE 0.86 10/02/2020   BUN 14 10/02/2020   NA 142 10/02/2020   K 4.0 10/02/2020   CL 103 10/02/2020   CO2 22 10/02/2020   Lab Results  Component Value Date   ALT 70 (H) 10/02/2020   AST 43 (H) 10/02/2020   ALKPHOS 85 10/02/2020   BILITOT 0.4 10/02/2020   Lab Results  Component Value Date   HGBA1C 5.2 10/02/2020   Lab Results  Component Value Date   INSULIN 37.0 (H) 10/02/2020   Lab Results  Component Value Date   TSH 1.820 10/02/2020   Lab Results  Component Value Date   CHOL 151 10/02/2020   HDL 36 (L) 10/02/2020   LDLCALC 93 10/02/2020   TRIG 123 10/02/2020   Lab Results  Component Value Date   WBC 5.4 10/02/2020   HGB 14.8 10/02/2020   HCT 44.3 10/02/2020   MCV 85 10/02/2020   PLT 170 05/20/2020   Attestation Statements:   Reviewed by clinician on day of visit: allergies, medications, problem list, medical history, surgical history, family history, social history, and previous encounter notes.  I, 05/22/2020, CMA, am acting as Insurance claims handler for Energy manager, DO.  I have reviewed the above documentation for accuracy and completeness, and I agree with the above. Donald Dixon, D.O.  The 21st Century Cures Act was signed into law in 2016 which includes the topic of electronic health records.  This provides immediate access to information in MyChart.  This includes consultation notes, operative notes, office notes, lab results and pathology reports.  If you have any questions about what you read please let 2017 know at your next visit so we can discuss your concerns and take corrective action if need be.  We are right here with you.

## 2020-11-14 ENCOUNTER — Encounter (INDEPENDENT_AMBULATORY_CARE_PROVIDER_SITE_OTHER): Payer: Self-pay | Admitting: Family Medicine

## 2020-11-14 ENCOUNTER — Ambulatory Visit (INDEPENDENT_AMBULATORY_CARE_PROVIDER_SITE_OTHER): Payer: 59 | Admitting: Family Medicine

## 2020-11-14 ENCOUNTER — Other Ambulatory Visit: Payer: Self-pay

## 2020-11-14 VITALS — BP 150/91 | HR 60 | Temp 98.7°F | Ht 70.0 in | Wt 300.0 lb

## 2020-11-14 DIAGNOSIS — I10 Essential (primary) hypertension: Secondary | ICD-10-CM | POA: Diagnosis not present

## 2020-11-14 DIAGNOSIS — Z6841 Body Mass Index (BMI) 40.0 and over, adult: Secondary | ICD-10-CM | POA: Diagnosis not present

## 2020-11-19 ENCOUNTER — Encounter (INDEPENDENT_AMBULATORY_CARE_PROVIDER_SITE_OTHER): Payer: Self-pay | Admitting: Family Medicine

## 2020-11-19 NOTE — Progress Notes (Signed)
Chief Complaint:   OBESITY Donald Dixon is here to discuss his progress with his obesity treatment plan along with follow-up of his obesity related diagnoses. Donald Dixon is on the Category 4 Plan and keeping a food journal and adhering to recommended goals of450-600calories and 40+protein for lunch and 550-700 calories and 50+ protein with dinner for 85-90% of the time. Donald Dixon states he is walking for 30 minutes 2-3 times per week.  Today's visit was #: 4 Starting weight: 315 lbs Starting date: 10/02/2020 Today's weight: 300 lbs Today's date: 11/14/2020 Total lbs lost to date: 15 lbs Total lbs lost since last in-office visit: 5 lbs  Interim History: Donald Dixon does not always eat the snack calories but is otherwise doing well on plan.  Hunger well satisfied.  He used to eat out quite a bit but not anymore.  His wife is also following our plan and has lost some weight.  He packs lunch daily.  Subjective:   1. Essential hypertension He does not know if he took his lisinopril this morning.  Blood pressure is elevated today.  BP Readings from Last 3 Encounters:  11/14/20 (!) 150/91  10/30/20 140/87  10/16/20 133/81   Assessment/Plan:   1. Essential hypertension Discussed methods to improve compliance such as a pill box.  Continue lisinopril.   2. Obesity, current BMI 43.05  Donald Dixon is currently in the action stage of change. As such, his goal is to continue with weight loss efforts. He has agreed to the Category 4 Plan.   Exercise goals: For substantial health benefits, adults should do at least 150 minutes (2 hours and 30 minutes) a week of moderate-intensity, or 75 minutes (1 hour and 15 minutes) a week of vigorous-intensity aerobic physical activity, or an equivalent combination of moderate- and vigorous-intensity aerobic activity. Aerobic activity should be performed in episodes of at least 10 minutes, and preferably, it should be spread throughout the week.  Discussed eventually  adding resistance training.  Behavioral modification strategies: planning for success.  Donald Dixon has agreed to follow-up with our clinic in 2-3 weeks with Dr. Lawson Radar or Dr. Sharee Holster.   Objective:   Blood pressure (!) 150/91, pulse 60, temperature 98.7 F (37.1 C), temperature source Oral, height 5\' 10"  (1.778 m), weight 300 lb (136.1 kg), SpO2 95 %. Body mass index is 43.05 kg/m.  General: Cooperative, alert, well developed, in no acute distress. HEENT: Conjunctivae and lids unremarkable. Cardiovascular: Regular rhythm.  Lungs: Normal work of breathing. Neurologic: No focal deficits.   Lab Results  Component Value Date   CREATININE 0.86 10/02/2020   BUN 14 10/02/2020   NA 142 10/02/2020   K 4.0 10/02/2020   CL 103 10/02/2020   CO2 22 10/02/2020   Lab Results  Component Value Date   ALT 70 (H) 10/02/2020   AST 43 (H) 10/02/2020   ALKPHOS 85 10/02/2020   BILITOT 0.4 10/02/2020   Lab Results  Component Value Date   HGBA1C 5.2 10/02/2020   Lab Results  Component Value Date   INSULIN 37.0 (H) 10/02/2020   Lab Results  Component Value Date   TSH 1.820 10/02/2020   Lab Results  Component Value Date   CHOL 151 10/02/2020   HDL 36 (L) 10/02/2020   LDLCALC 93 10/02/2020   TRIG 123 10/02/2020   Lab Results  Component Value Date   WBC 5.4 10/02/2020   HGB 14.8 10/02/2020   HCT 44.3 10/02/2020   MCV 85 10/02/2020   PLT 170 05/20/2020  Attestation Statements:   Reviewed by clinician on day of visit: allergies, medications, problem list, medical history, surgical history, family history, social history, and previous encounter notes.  I, Insurance claims handler, CMA, am acting as Energy manager for [place], FNP.  I have reviewed the above documentation for accuracy and completeness, and I agree with the above. -  Jesse Sans, FNP

## 2020-12-16 ENCOUNTER — Encounter (INDEPENDENT_AMBULATORY_CARE_PROVIDER_SITE_OTHER): Payer: Self-pay | Admitting: Family Medicine

## 2020-12-16 ENCOUNTER — Ambulatory Visit (INDEPENDENT_AMBULATORY_CARE_PROVIDER_SITE_OTHER): Payer: 59 | Admitting: Family Medicine

## 2020-12-16 ENCOUNTER — Other Ambulatory Visit: Payer: Self-pay

## 2020-12-16 VITALS — BP 143/92 | HR 63 | Temp 98.5°F | Ht 70.0 in | Wt 293.0 lb

## 2020-12-16 DIAGNOSIS — Z6841 Body Mass Index (BMI) 40.0 and over, adult: Secondary | ICD-10-CM

## 2020-12-16 DIAGNOSIS — Z9189 Other specified personal risk factors, not elsewhere classified: Secondary | ICD-10-CM | POA: Diagnosis not present

## 2020-12-16 DIAGNOSIS — I10 Essential (primary) hypertension: Secondary | ICD-10-CM

## 2020-12-16 DIAGNOSIS — E559 Vitamin D deficiency, unspecified: Secondary | ICD-10-CM

## 2020-12-16 MED ORDER — VITAMIN D (ERGOCALCIFEROL) 1.25 MG (50000 UNIT) PO CAPS
50000.0000 [IU] | ORAL_CAPSULE | ORAL | 0 refills | Status: DC
Start: 2020-12-16 — End: 2021-01-06

## 2020-12-17 NOTE — Progress Notes (Signed)
Chief Complaint:   OBESITY Donald Dixon is here to discuss his progress with his obesity treatment plan along with follow-up of his obesity related diagnoses. Donald Dixon is on the Category 4 Plan and states he is following his eating plan approximately 80% of the time. Donald Dixon states he is walking 30-45 minutes 4 times per week.  Today's visit was #: 5 Starting weight: 315 lbs Starting date: 10/02/2020 Today's weight: 293 lbs Today's date: 12/16/2020 Total lbs lost to date: 22 Total lbs lost since last in-office visit: 7  Interim History: Donald Dixon's wife recently tested positive for COVID. He had a few trips since his last appt. He feels he has been doing fairly well on meal plan, except last week when he had to eat out every meal. He is trying to be more mindful of food choices.  Subjective:   1. Vitamin D deficiency Donald Dixon denies nausea, vomiting, and muscle weakness but notes fatigue. He is on prescription Vit D. His last Vit D level was 9.1.  2. Essential hypertension Donald Dixon's BP is slightly elevated today. Pt denies chest pain/chest pressure/headache. His last 2 BP's were also elevated.   BP Readings from Last 3 Encounters:  12/16/20 (!) 143/92  11/14/20 (!) 150/91  10/30/20 140/87   3. At risk for osteoporosis Donald Dixon is at higher risk of osteopenia and osteoporosis due to Vitamin D deficiency.   Assessment/Plan:   1. Vitamin D deficiency Low Vitamin D level contributes to fatigue and are associated with obesity, breast, and colon cancer. He agrees to continue to take prescription Vitamin D @50 ,000 IU every week and will follow-up for routine testing of Vitamin D, at least 2-3 times per year to avoid over-replacement.  - Vitamin D, Ergocalciferol, (DRISDOL) 1.25 MG (50000 UNIT) CAPS capsule; Take 1 capsule (50,000 Units total) by mouth every 7 (seven) days.  Dispense: 4 capsule; Refill: 0  2. Essential hypertension Donald Dixon is working on healthy weight loss and  exercise to improve blood pressure control. We will watch for signs of hypotension as he continues his lifestyle modifications. Follow up BP at next appt.  3. At risk for osteoporosis Donald Dixon was given approximately 15 minutes of osteoporosis prevention counseling today. Donald Dixon is at risk for osteopenia and osteoporosis due to his Vitamin D deficiency. He was encouraged to take his Vitamin D and follow his higher calcium diet and increase strengthening exercise to help strengthen his bones and decrease his risk of osteopenia and osteoporosis.  Repetitive spaced learning was employed today to elicit superior memory formation and behavioral change.  4. Class 3 severe obesity with serious comorbidity and body mass index (BMI) of 45.0 to 49.9 in adult, unspecified obesity type Donald Dixon)  Donald Dixon is currently in the action stage of change. As such, his goal is to continue with weight loss efforts. He has agreed to the Category 4 Plan.   Exercise goals: All adults should avoid inactivity. Some physical activity is better than none, and adults who participate in any amount of physical activity gain some health benefits.  Behavioral modification strategies: increasing lean protein intake, meal planning and cooking strategies, keeping healthy foods in the home, and planning for success.  Donald Dixon has agreed to follow-up with our clinic in 2-3 weeks. He was informed of the importance of frequent follow-up visits to maximize his success with intensive lifestyle modifications for his multiple health conditions.   Objective:   Blood pressure (!) 143/92, pulse 63, temperature 98.5 F (36.9 C), height 5\' 10"  (1.778 m),  weight 293 lb (132.9 kg), SpO2 97 %. Body mass index is 42.04 kg/m.  General: Cooperative, alert, well developed, in no acute distress. HEENT: Conjunctivae and lids unremarkable. Cardiovascular: Regular rhythm.  Lungs: Normal work of breathing. Neurologic: No focal deficits.   Lab  Results  Component Value Date   CREATININE 0.86 10/02/2020   BUN 14 10/02/2020   NA 142 10/02/2020   K 4.0 10/02/2020   CL 103 10/02/2020   CO2 22 10/02/2020   Lab Results  Component Value Date   ALT 70 (H) 10/02/2020   AST 43 (H) 10/02/2020   ALKPHOS 85 10/02/2020   BILITOT 0.4 10/02/2020   Lab Results  Component Value Date   HGBA1C 5.2 10/02/2020   Lab Results  Component Value Date   INSULIN 37.0 (H) 10/02/2020   Lab Results  Component Value Date   TSH 1.820 10/02/2020   Lab Results  Component Value Date   CHOL 151 10/02/2020   HDL 36 (L) 10/02/2020   LDLCALC 93 10/02/2020   TRIG 123 10/02/2020   Lab Results  Component Value Date   WBC 5.4 10/02/2020   HGB 14.8 10/02/2020   HCT 44.3 10/02/2020   MCV 85 10/02/2020   PLT 170 05/20/2020   No results found for: IRON, TIBC, FERRITIN   Attestation Statements:   Reviewed by clinician on day of visit: allergies, medications, problem list, medical history, surgical history, family history, social history, and previous encounter notes.  Donald Dixon, CMA, am acting as transcriptionist for Donald Likes, MD. I have reviewed the above documentation for accuracy and completeness, and I agree with the above. - Donald Mires, MD

## 2021-01-06 ENCOUNTER — Encounter (INDEPENDENT_AMBULATORY_CARE_PROVIDER_SITE_OTHER): Payer: Self-pay | Admitting: Family Medicine

## 2021-01-06 ENCOUNTER — Other Ambulatory Visit: Payer: Self-pay

## 2021-01-06 ENCOUNTER — Ambulatory Visit (INDEPENDENT_AMBULATORY_CARE_PROVIDER_SITE_OTHER): Payer: 59 | Admitting: Family Medicine

## 2021-01-06 VITALS — BP 130/78 | HR 66 | Temp 98.0°F | Ht 70.0 in | Wt 292.0 lb

## 2021-01-06 DIAGNOSIS — Z9189 Other specified personal risk factors, not elsewhere classified: Secondary | ICD-10-CM

## 2021-01-06 DIAGNOSIS — Z6841 Body Mass Index (BMI) 40.0 and over, adult: Secondary | ICD-10-CM

## 2021-01-06 DIAGNOSIS — E559 Vitamin D deficiency, unspecified: Secondary | ICD-10-CM

## 2021-01-06 DIAGNOSIS — I10 Essential (primary) hypertension: Secondary | ICD-10-CM

## 2021-01-06 MED ORDER — VITAMIN D (ERGOCALCIFEROL) 1.25 MG (50000 UNIT) PO CAPS
50000.0000 [IU] | ORAL_CAPSULE | ORAL | 0 refills | Status: DC
Start: 2021-01-06 — End: 2021-01-23

## 2021-01-08 NOTE — Progress Notes (Signed)
Chief Complaint:   OBESITY Donald Dixon is here to discuss his progress with his obesity treatment plan along with follow-up of his obesity related diagnoses. Donald Dixon is on the Category 3 Plan and the Category 4 Plan and states he is following his eating plan approximately 70% of the time. Donald Dixon states he is walking 30 minutes 3-4 times per week.  Today's visit was #: 6 Starting weight: 315 lbs Starting date: 10/02/2020 Today's weight: 292 lbs Today's date: 01/06/2021 Total lbs lost to date: 23 Total lbs lost since last in-office visit: 1  Interim History: Donald Dixon has been working a lot- super busy. He has been called in a few times overnight. Food wise, he hasn't been able to pack lunch or plan meals. He is eating out more frequently.  Subjective:   1. Vitamin D deficiency Pt denies nausea, vomiting, and muscle weakness but notes fatigue. Pt is on prescription Vit D.  2. Essential hypertension Pt denies chest pain/chest pressure/headache. He is on lisinopril 10 mg.  3. At risk for osteoporosis Donald Dixon is at higher risk of osteopenia and osteoporosis due to Vitamin D deficiency.   Assessment/Plan:   1. Vitamin D deficiency Low Vitamin D level contributes to fatigue and are associated with obesity, breast, and colon cancer. He agrees to continue to take prescription Vitamin D @50 ,000 IU every week and will follow-up for routine testing of Vitamin D, at least 2-3 times per year to avoid over-replacement.  Refill- Vitamin D, Ergocalciferol, (DRISDOL) 1.25 MG (50000 UNIT) CAPS capsule; Take 1 capsule (50,000 Units total) by mouth every 7 (seven) days.  Dispense: 4 capsule; Refill: 0  2. Essential hypertension Donald Dixon is working on healthy weight loss and exercise to improve blood pressure control. We will watch for signs of hypotension as he continues his lifestyle modifications. Continue lisinopril. No refill needed at this time.  3. At risk for osteoporosis Donald Dixon was  given approximately 15 minutes of osteoporosis prevention counseling today. Donald Dixon is at risk for osteopenia and osteoporosis due to his Vitamin D deficiency. He was encouraged to take his Vitamin D and follow his higher calcium diet and increase strengthening exercise to help strengthen his bones and decrease his risk of osteopenia and osteoporosis.  Repetitive spaced learning was employed today to elicit superior memory formation and behavioral change.  4. Class 3 severe obesity with serious comorbidity and body mass index (BMI) of 45.0 to 49.9 in adult, unspecified obesity type [facility])  Donald Dixon is currently in the action stage of change. As such, his goal is to continue with weight loss efforts. He has agreed to the Category 3 Plan, the Category 4 Plan, and keeping a food journal and adhering to recommended goals of 2000-2200 calories and 130+ g protein.   Exercise goals: All adults should avoid inactivity. Some physical activity is better than none, and adults who participate in any amount of physical activity gain some health benefits.  Behavioral modification strategies: increasing lean protein intake, meal planning and cooking strategies, keeping healthy foods in the home, and planning for success.  Donald Dixon has agreed to follow-up with our clinic in 2-3 weeks. He was informed of the importance of frequent follow-up visits to maximize his success with intensive lifestyle modifications for his multiple health conditions.   Objective:   Blood pressure 130/78, pulse 66, temperature 98 F (36.7 C), height 5\' 10"  (1.778 m), weight 292 lb (132.5 kg), SpO2 97 %. Body mass index is 41.9 kg/m.  General: Cooperative, alert, well developed,  in no acute distress. HEENT: Conjunctivae and lids unremarkable. Cardiovascular: Regular rhythm.  Lungs: Normal work of breathing. Neurologic: No focal deficits.   Lab Results  Component Value Date   CREATININE 0.86 10/02/2020   BUN 14 10/02/2020   NA  142 10/02/2020   K 4.0 10/02/2020   CL 103 10/02/2020   CO2 22 10/02/2020   Lab Results  Component Value Date   ALT 70 (H) 10/02/2020   AST 43 (H) 10/02/2020   ALKPHOS 85 10/02/2020   BILITOT 0.4 10/02/2020   Lab Results  Component Value Date   HGBA1C 5.2 10/02/2020   Lab Results  Component Value Date   INSULIN 37.0 (H) 10/02/2020   Lab Results  Component Value Date   TSH 1.820 10/02/2020   Lab Results  Component Value Date   CHOL 151 10/02/2020   HDL 36 (L) 10/02/2020   LDLCALC 93 10/02/2020   TRIG 123 10/02/2020   Lab Results  Component Value Date   VD25OH 9.1 (L) 10/02/2020   Lab Results  Component Value Date   WBC 5.4 10/02/2020   HGB 14.8 10/02/2020   HCT 44.3 10/02/2020   MCV 85 10/02/2020   PLT 170 05/20/2020   No results found for: IRON, TIBC, FERRITIN  Attestation Statements:   Reviewed by clinician on day of visit: allergies, medications, problem list, medical history, surgical history, family history, social history, and previous encounter notes.  Edmund Hilda, CMA, am acting as transcriptionist for Reuben Likes, MD.  I have reviewed the above documentation for accuracy and completeness, and I agree with the above. - Reuben Likes, MD

## 2021-01-23 ENCOUNTER — Encounter (INDEPENDENT_AMBULATORY_CARE_PROVIDER_SITE_OTHER): Payer: Self-pay | Admitting: Family Medicine

## 2021-01-23 ENCOUNTER — Other Ambulatory Visit: Payer: Self-pay

## 2021-01-23 ENCOUNTER — Ambulatory Visit (INDEPENDENT_AMBULATORY_CARE_PROVIDER_SITE_OTHER): Payer: 59 | Admitting: Family Medicine

## 2021-01-23 VITALS — BP 128/84 | HR 68 | Temp 97.5°F | Ht 70.0 in | Wt 291.0 lb

## 2021-01-23 DIAGNOSIS — I10 Essential (primary) hypertension: Secondary | ICD-10-CM | POA: Diagnosis not present

## 2021-01-23 DIAGNOSIS — E559 Vitamin D deficiency, unspecified: Secondary | ICD-10-CM

## 2021-01-23 DIAGNOSIS — Z9189 Other specified personal risk factors, not elsewhere classified: Secondary | ICD-10-CM

## 2021-01-23 DIAGNOSIS — Z6841 Body Mass Index (BMI) 40.0 and over, adult: Secondary | ICD-10-CM

## 2021-01-23 MED ORDER — VITAMIN D (ERGOCALCIFEROL) 1.25 MG (50000 UNIT) PO CAPS: 50000.0000 [IU] | ORAL_CAPSULE | ORAL | 0 refills | Status: DC

## 2021-01-27 NOTE — Progress Notes (Signed)
Chief Complaint:   OBESITY Donald Dixon is here to discuss his progress with his obesity treatment plan along with follow-up of his obesity related diagnoses. Donald Dixon is on the Category 3 Plan, the Category 4 Plan, and keeping a food journal and adhering to recommended goals of 2000-2200 calories and 130 grams protein and states he is following his eating plan approximately 80% of the time. Donald Dixon states he is walking 30 minutes 2-3 times per week.  Today's visit was #: 7 Starting weight: 315 lbs Starting date: 10/02/2020 Today's weight: 291 lbs Today's date: 01/23/2021 Total lbs lost to date: 24 Total lbs lost since last in-office visit: 1  Interim History: Donald Dixon has been working almost nonstop in the last 2 weeks. His problem is working late nights and then limited choices for dinner. 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 After vacation, life returning somewhat to normal.  Subjective:   1. Vitamin D deficiency Donald Dixon denies nausea, vomiting, and muscle weakness but notes fatigue. Pt is on prescription Vit D.  2. Essential hypertension Donald Dixon's BP is well controlled today. Pt denies chest pain/chest pressure/headache. His BP was previously slightly elevated.  3. At risk for osteoporosis Donald Dixon is at higher risk of osteopenia and osteoporosis due to Vitamin D deficiency.   Assessment/Plan:   1. Vitamin D deficiency Low Vitamin D level contributes to fatigue and are associated with obesity, breast, and colon cancer. He agrees to continue to take prescription Vitamin D @50 ,000 IU every week and will follow-up for routine testing of Vitamin D, at least 2-3 times per year to avoid over-replacement.  Refill- Vitamin D, Ergocalciferol, (DRISDOL) 1.25 MG (50000 UNIT) CAPS capsule; Take 1 capsule (50,000 Units total) by mouth every 7 (seven) days.  Dispense: 4 capsule; Refill: 0  2. Essential hypertension Donald Dixon is working on healthy weight loss and exercise  to improve blood pressure control. We will watch for signs of hypotension as he continues his lifestyle modifications. Follow up BP at next appt.  3. At risk for osteoporosis Donald Dixon was given approximately 15 minutes of osteoporosis prevention counseling today. Donald Dixon is at risk for osteopenia and osteoporosis due to his Vitamin D deficiency. He was encouraged to take his Vitamin D and follow his higher calcium diet and increase strengthening exercise to help strengthen his bones and decrease his risk of osteopenia and osteoporosis.  Repetitive spaced learning was employed today to elicit superior memory formation and behavioral change.  4. Obesity with current BMI of 41.9  Donald Dixon is currently in the action stage of change. As such, his goal is to continue with weight loss efforts. He has agreed to keeping a food journal and adhering to recommended goals of 2000-2200 calories and 130+ grams protein.   Exercise goals:  As is- Pt is to plan what activity he would like to do for resistance.  Behavioral modification strategies: increasing lean protein intake, meal planning and cooking strategies, keeping healthy foods in the home, and planning for success.  Donald Dixon has agreed to follow-up with our clinic in 3 weeks. He was informed of the importance of frequent follow-up visits to maximize his success with intensive lifestyle modifications for his multiple health conditions.   Objective:   Blood pressure 128/84, pulse 68, temperature (!) 97.5 F (36.4 C), height 5\' 10"  (1.778 m), weight 291 lb (132 kg), SpO2 98 %. Body mass index is 41.75 kg/m.  General: Cooperative, alert, well developed, in no acute distress. HEENT: Conjunctivae and lids unremarkable.  Cardiovascular: Regular rhythm.  Lungs: Normal work of breathing. Neurologic: No focal deficits.   Lab Results  Component Value Date   CREATININE 0.86 10/02/2020   BUN 14 10/02/2020   NA 142 10/02/2020   K 4.0 10/02/2020   CL  103 10/02/2020   CO2 22 10/02/2020   Lab Results  Component Value Date   ALT 70 (H) 10/02/2020   AST 43 (H) 10/02/2020   ALKPHOS 85 10/02/2020   BILITOT 0.4 10/02/2020   Lab Results  Component Value Date   HGBA1C 5.2 10/02/2020   Lab Results  Component Value Date   INSULIN 37.0 (H) 10/02/2020   Lab Results  Component Value Date   TSH 1.820 10/02/2020   Lab Results  Component Value Date   CHOL 151 10/02/2020   HDL 36 (L) 10/02/2020   LDLCALC 93 10/02/2020   TRIG 123 10/02/2020   Lab Results  Component Value Date   VD25OH 9.1 (L) 10/02/2020   Lab Results  Component Value Date   WBC 5.4 10/02/2020   HGB 14.8 10/02/2020   HCT 44.3 10/02/2020   MCV 85 10/02/2020   PLT 170 05/20/2020    Attestation Statements:   Reviewed by clinician on day of visit: allergies, medications, problem list, medical history, surgical history, family history, social history, and previous encounter notes.  Edmund Hilda, CMA, am acting as transcriptionist for Reuben Likes, MD.  I have reviewed the above documentation for accuracy and completeness, and I agree with the above. - Reuben Likes, MD

## 2021-02-13 ENCOUNTER — Ambulatory Visit (INDEPENDENT_AMBULATORY_CARE_PROVIDER_SITE_OTHER): Payer: 59 | Admitting: Family Medicine

## 2021-02-17 ENCOUNTER — Ambulatory Visit (INDEPENDENT_AMBULATORY_CARE_PROVIDER_SITE_OTHER): Payer: 59 | Admitting: Bariatrics

## 2021-02-17 ENCOUNTER — Encounter (INDEPENDENT_AMBULATORY_CARE_PROVIDER_SITE_OTHER): Payer: Self-pay | Admitting: Bariatrics

## 2021-02-17 ENCOUNTER — Other Ambulatory Visit: Payer: Self-pay

## 2021-02-17 VITALS — BP 142/86 | HR 71 | Temp 98.6°F | Ht 70.0 in | Wt 294.0 lb

## 2021-02-17 DIAGNOSIS — I10 Essential (primary) hypertension: Secondary | ICD-10-CM

## 2021-02-17 DIAGNOSIS — E8881 Metabolic syndrome: Secondary | ICD-10-CM | POA: Diagnosis not present

## 2021-02-17 DIAGNOSIS — Z6841 Body Mass Index (BMI) 40.0 and over, adult: Secondary | ICD-10-CM | POA: Diagnosis not present

## 2021-02-18 NOTE — Progress Notes (Signed)
Chief Complaint:   OBESITY Donald Dixon is here to discuss his progress with his obesity treatment plan along with follow-up of his obesity related diagnoses. Donald Dixon is on the Category 4 Plan and states he is following his eating plan approximately 60% of the time. Donald Dixon states he is walking for 60 minutes 3 times per week.  Today's visit was #: 8 Starting weight: 315 lbs Starting date: 10/02/2020 Today's weight: 294 lbs Today's date: 02/17/2021 Total lbs lost to date: 21 lbs Total lbs lost since last in-office visit: 0  Interim History: Donald Dixon is up 3 lbs since his last visit. He has been on vacation which made his meal planning more challenging. Donald Dixon He is up about 6 lbs of water per the bioimpedance.  Subjective:   1. Insulin resistance Donald Dixon is currently not on medication.  2. Essential hypertension Donald Dixon is taking Zestril. His hypertension is reasonably controlled.  Assessment/Plan:   1. Insulin resistance Donald Dixon will continue to adhere closely to the plan. He will work on weight loss, exercise, and decreasing simple carbohydrates to help decrease the risk of diabetes. Donald Dixon agreed to follow-up with [name] as directed to closely monitor his progress.   2. Essential hypertension Donald Dixon will continue medications. He is working on healthy weight loss and exercise to improve blood pressure control. We will watch for signs of hypotension as he continues his lifestyle modifications.   3. Obesity with current BMI of 42.3 Donald Dixon is currently in the action stage of change. As such, his goal is to continue with weight loss efforts. He has agreed to the Category 4 Plan.   Donald Dixon will continue meal planning. He will adhere closely to the pland and re-establish his routine.  Exercise goals:  As is. Donald Dixon will continue to walk and increase .  Behavioral modification strategies: increasing lean protein intake, decreasing simple carbohydrates, increasing vegetables,  increasing water intake, decreasing eating out, no skipping meals, meal planning and cooking strategies, keeping healthy foods in the home, and planning for success.  Donald Dixon has agreed to follow-up with our clinic in 3 weeks with Dr. Lawson Radar or William Hamburger, Np . He was informed of the importance of frequent follow-up visits to maximize his success with intensive lifestyle modifications for his multiple health conditions.   Objective:   Blood pressure (!) 142/86, pulse 71, temperature 98.6 F (37 C), height 5\' 10"  (1.778 m), weight 294 lb (133.4 kg), SpO2 97 %. Body mass index is 42.18 kg/m.  General: Cooperative, alert, well developed, in no acute distress. HEENT: Conjunctivae and lids unremarkable. Cardiovascular: Regular rhythm.  Lungs: Normal work of breathing. Neurologic: No focal deficits.   Lab Results  Component Value Date   CREATININE 0.86 10/02/2020   BUN 14 10/02/2020   NA 142 10/02/2020   K 4.0 10/02/2020   CL 103 10/02/2020   CO2 22 10/02/2020   Lab Results  Component Value Date   ALT 70 (H) 10/02/2020   AST 43 (H) 10/02/2020   ALKPHOS 85 10/02/2020   BILITOT 0.4 10/02/2020   Lab Results  Component Value Date   HGBA1C 5.2 10/02/2020   Lab Results  Component Value Date   INSULIN 37.0 (H) 10/02/2020   Lab Results  Component Value Date   TSH 1.820 10/02/2020   Lab Results  Component Value Date   CHOL 151 10/02/2020   HDL 36 (L) 10/02/2020   LDLCALC 93 10/02/2020   TRIG 123 10/02/2020   Lab Results  Component Value Date  VD25OH 9.1 (L) 10/02/2020   Lab Results  Component Value Date   WBC 5.4 10/02/2020   HGB 14.8 10/02/2020   HCT 44.3 10/02/2020   MCV 85 10/02/2020   PLT 170 05/20/2020   No results found for: IRON, TIBC, FERRITIN  Attestation Statements:   Reviewed by clinician on day of visit: allergies, medications, problem list, medical history, surgical history, family history, social history, and previous encounter notes.  Time  spent on visit including pre-visit chart review and post-visit care and charting was 20 minutes.   I, Jackson Latino, RMA, am acting as Energy manager for [employer], DO.   I have reviewed the above documentation for accuracy and completeness, and I agree with the above. Corinna Capra, DO

## 2021-02-19 ENCOUNTER — Encounter (INDEPENDENT_AMBULATORY_CARE_PROVIDER_SITE_OTHER): Payer: Self-pay | Admitting: Bariatrics

## 2021-03-17 ENCOUNTER — Other Ambulatory Visit: Payer: Self-pay

## 2021-03-17 ENCOUNTER — Ambulatory Visit (INDEPENDENT_AMBULATORY_CARE_PROVIDER_SITE_OTHER): Payer: 59 | Admitting: Family Medicine

## 2021-03-17 ENCOUNTER — Telehealth: Payer: Self-pay | Admitting: Neurology

## 2021-03-17 ENCOUNTER — Encounter (INDEPENDENT_AMBULATORY_CARE_PROVIDER_SITE_OTHER): Payer: Self-pay | Admitting: Family Medicine

## 2021-03-17 VITALS — BP 138/93 | HR 67 | Temp 98.0°F | Ht 70.0 in | Wt 288.0 lb

## 2021-03-17 DIAGNOSIS — E88819 Insulin resistance, unspecified: Secondary | ICD-10-CM

## 2021-03-17 DIAGNOSIS — I10 Essential (primary) hypertension: Secondary | ICD-10-CM | POA: Diagnosis not present

## 2021-03-17 DIAGNOSIS — K760 Fatty (change of) liver, not elsewhere classified: Secondary | ICD-10-CM

## 2021-03-17 DIAGNOSIS — E559 Vitamin D deficiency, unspecified: Secondary | ICD-10-CM

## 2021-03-17 DIAGNOSIS — E66813 Obesity, class 3: Secondary | ICD-10-CM

## 2021-03-17 DIAGNOSIS — Z9189 Other specified personal risk factors, not elsewhere classified: Secondary | ICD-10-CM

## 2021-03-17 DIAGNOSIS — Z6841 Body Mass Index (BMI) 40.0 and over, adult: Secondary | ICD-10-CM

## 2021-03-17 DIAGNOSIS — E8881 Metabolic syndrome: Secondary | ICD-10-CM

## 2021-03-17 DIAGNOSIS — E786 Lipoprotein deficiency: Secondary | ICD-10-CM

## 2021-03-17 MED ORDER — VITAMIN D (ERGOCALCIFEROL) 1.25 MG (50000 UNIT) PO CAPS: 50000.0000 [IU] | ORAL_CAPSULE | ORAL | 0 refills | Status: AC

## 2021-03-17 NOTE — Telephone Encounter (Signed)
..   Pt understands that although there may be some limitations with this type of visit, we will take all precautions to reduce any security or privacy concerns.  Pt understands that this will be treated like an in office visit and we will file with pt's insurance, and there may be a patient responsible charge related to this service. ? ?

## 2021-03-18 NOTE — Progress Notes (Signed)
Chief Complaint:   OBESITY Donald Dixon is here to discuss his progress with his obesity treatment plan along with follow-up of his obesity related diagnoses. Donald Dixon is on the Category 4 Plan and states he is following his eating plan approximately 70% of the time. Donald Dixon states he is walking 60 minutes 4 times per week.  Today's visit was #: 9 Starting weight: 315 lbs Starting date: 10/02/2020 Today's weight: 288 lbs Today's date: 03/17/2021 Total lbs lost to date: 27 Total lbs lost since last in-office visit: 6  Interim History: Donald Dixon is a pt of Dr. Lawson Radar. When he travels, it is difficult to stay on plan.  Donald Dixon is here for a follow up office visit.  We reviewed his meal plan and questions were answered.  Patient's food recall appears to be accurate and consistent with what is on plan when he is following it.   When eating on plan, his hunger and cravings are well controlled.    Subjective:   1. Insulin resistance Donald Dixon has a diagnosis of insulin resistance based on his elevated fasting insulin level >5. He continues to work on diet and exercise to decrease his risk of diabetes.  Lab Results  Component Value Date   INSULIN 37.0 (H) 10/02/2020   Lab Results  Component Value Date   HGBA1C 5.2 10/02/2020   2. Vitamin D deficiency He is currently taking prescription vitamin D 50,000 IU each week. He denies nausea, vomiting or muscle weakness. Donald Dixon is tolerating medication(s) well without side effects.  Medication compliance is good and patient appears to be taking it as prescribed.  Denies additional concerns regarding this condition.   3. Essential hypertension A little bit elevated today.   BP Readings from Last 3 Encounters:  03/17/21 (!) 138/93  02/17/21 (!) 142/86  01/23/21 128/84   Lab Results  Component Value Date   CREATININE 0.86 10/02/2020   CREATININE 0.83 05/20/2020   CREATININE 0.92 05/19/2020   4. NAFLD (nonalcoholic  fatty liver disease) Donald Dixon has a new dx of elevated ALT. His BMI is over 40. He denies abdominal pain or jaundice and has never been told of any liver problems in the past. He denies excessive alcohol intake.  Lab Results  Component Value Date   ALT 70 (H) 10/02/2020   AST 43 (H) 10/02/2020   ALKPHOS 85 10/02/2020   BILITOT 0.4 10/02/2020   5. Low HDL (under 40) Pt's HDL was at 36 in April.   Lab Results  Component Value Date   CHOL 151 10/02/2020   HDL 36 (L) 10/02/2020   LDLCALC 93 10/02/2020   TRIG 123 10/02/2020   6. At risk for diabetes mellitus Donald Dixon is at higher than average risk for developing diabetes due to obesity.   Assessment/Plan:   Orders Placed This Encounter  Procedures   VITAMIN D 25 Hydroxy (Vit-D Deficiency, Fractures)   Insulin, random   Hemoglobin A1c   Lipid Panel With LDL/HDL Ratio   Comprehensive metabolic panel    Medications Discontinued During This Encounter  Medication Reason   Vitamin D, Ergocalciferol, (DRISDOL) 1.25 MG (50000 UNIT) CAPS capsule Reorder     Meds ordered this encounter  Medications   Vitamin D, Ergocalciferol, (DRISDOL) 1.25 MG (50000 UNIT) CAPS capsule    Sig: Take 1 capsule (50,000 Units total) by mouth every 7 (seven) days.    Dispense:  4 capsule    Refill:  0    30 d supply;  ** OV for  RF **   Do not send RF request     1. Insulin resistance Donald Dixon will continue to work on weight loss, exercise, and decreasing simple carbohydrates to help decrease the risk of diabetes. Donald Dixon agreed to follow-up with [name] as directed to closely monitor his progress. Check labs at next OV.  - Insulin, random - Hemoglobin A1c  2. Vitamin D deficiency Low Vitamin D level contributes to fatigue and are associated with obesity, breast, and colon cancer. He agrees to continue to take prescription Vitamin D 50,000 IU every week and will follow-up for routine testing of Vitamin D, at least 2-3 times per year to avoid  over-replacement. Check labs at next OV.  Refill- Vitamin D, Ergocalciferol, (DRISDOL) 1.25 MG (50000 UNIT) CAPS capsule; Take 1 capsule (50,000 Units total) by mouth every 7 (seven) days.  Dispense: 4 capsule; Refill: 0  - VITAMIN D 25 Hydroxy (Vit-D Deficiency, Fractures)  3. Essential hypertension Donald Dixon is working on healthy weight loss and exercise to improve blood pressure control. We will watch for signs of hypotension as he continues his lifestyle modifications. Decrease salt intake and increase water intake.  4. NAFLD (nonalcoholic fatty liver disease) We discussed the likely diagnosis of non-alcoholic fatty liver disease today and how this condition is obesity related. Donald Dixon was educated the importance of weight loss. Donald Dixon agreed to continue with his weight loss efforts with healthier diet and exercise as an essential part of his treatment plan. Check labs at next OV.   - Comprehensive metabolic panel  5. Low HDL (under 40) Repeat fasting lipid panel at next OV.  - Lipid Panel With LDL/HDL Ratio  6. At risk for diabetes mellitus Donald Dixon was given approximately 9 minutes of diabetes education and counseling today. We discussed intensive lifestyle modifications today with an emphasis on weight loss as well as increasing exercise and decreasing simple carbohydrates in his diet. We also reviewed medication options with an emphasis on risk versus benefit of those discussed.   Repetitive spaced learning was employed today to elicit superior memory formation and behavioral change.  7. Obesity with current BMI of 41.4  Donald Dixon is currently in the action stage of change. As such, his goal is to continue with weight loss efforts. He has agreed to the Category 4 Plan.   Exercise goals:  Continue cardio but add strength training 2 days a week.  Behavioral modification strategies: travel eating strategies and planning for success.  Donald Dixon has agreed to follow-up with our  clinic in 3-4 weeks fasting for blood work. He was informed of the importance of frequent follow-up visits to maximize his success with intensive lifestyle modifications for his multiple health conditions.   Objective:   Blood pressure (!) 138/93, pulse 67, temperature 98 F (36.7 C), height 5\' 10"  (1.778 m), weight 288 lb (130.6 kg), SpO2 97 %. Body mass index is 41.32 kg/m.  General: Cooperative, alert, well developed, in no acute distress. HEENT: Conjunctivae and lids unremarkable. Cardiovascular: Regular rhythm.  Lungs: Normal work of breathing. Neurologic: No focal deficits.   Lab Results  Component Value Date   CREATININE 0.86 10/02/2020   BUN 14 10/02/2020   NA 142 10/02/2020   K 4.0 10/02/2020   CL 103 10/02/2020   CO2 22 10/02/2020   Lab Results  Component Value Date   ALT 70 (H) 10/02/2020   AST 43 (H) 10/02/2020   ALKPHOS 85 10/02/2020   BILITOT 0.4 10/02/2020   Lab Results  Component Value Date  HGBA1C 5.2 10/02/2020   Lab Results  Component Value Date   INSULIN 37.0 (H) 10/02/2020   Lab Results  Component Value Date   TSH 1.820 10/02/2020   Lab Results  Component Value Date   CHOL 151 10/02/2020   HDL 36 (L) 10/02/2020   LDLCALC 93 10/02/2020   TRIG 123 10/02/2020   Lab Results  Component Value Date   VD25OH 9.1 (L) 10/02/2020   Lab Results  Component Value Date   WBC 5.4 10/02/2020   HGB 14.8 10/02/2020   HCT 44.3 10/02/2020   MCV 85 10/02/2020   PLT 170 05/20/2020    Attestation Statements:   Reviewed by clinician on day of visit: allergies, medications, problem list, medical history, surgical history, family history, social history, and previous encounter notes.  Edmund Hilda, CMA, am acting as transcriptionist for [employer], DO.  I have reviewed the above documentation for accuracy and completeness, and I agree with the above. Carlye Grippe, D.O.  The 21st Century Cures Act was signed into law in 2016 which  includes the topic of electronic health records.  This provides immediate access to information in MyChart.  This includes consultation notes, operative notes, office notes, lab results and pathology reports.  If you have any questions about what you read please let us know at your next visit so we can discuss your concerns and take corrective action if need be.  We are right here with you.

## 2021-03-27 NOTE — Patient Instructions (Signed)
Please continue using your BiPAP regularly. While your insurance requires that you use BiPAP at least 4 hours each night on 70% of the nights, I recommend, that you not skip any nights and use it throughout the night if you can. Getting used to BiPAP and staying with the treatment long term does take time and patience and discipline. Untreated obstructive sleep apnea when it is moderate to severe can have an adverse impact on cardiovascular health and raise her risk for heart disease, arrhythmias, hypertension, congestive heart failure, stroke and diabetes. Untreated obstructive sleep apnea causes sleep disruption, nonrestorative sleep, and sleep deprivation. This can have an impact on your day to day functioning and cause daytime sleepiness and impairment of cognitive function, memory loss, mood disturbance, and problems focussing. Using BiPAP regularly can improve these symptoms.   

## 2021-03-27 NOTE — Progress Notes (Signed)
PATIENT: Donald Dixon DOB: 01-12-1976  REASON FOR VISIT: follow up HISTORY FROM: patient  Virtual Visit via Telephone Note  I connected with Donald Dixon on 03/31/21 at 11:00 AM EDT by telephone and verified that I am speaking with the correct person using two identifiers.   I discussed the limitations, risks, security and privacy concerns of performing an evaluation and management service by telephone and the availability of in person appointments. I also discussed with the patient that there may be a patient responsible charge related to this service. The patient expressed understanding and agreed to proceed.   History of Present Illness:  03/31/21 ALL: Donald Dixon is a 45 y.o. male here today for follow up for OSA on BiPAP. Set up date 07/03/2020. He continues to do well with therapy. He is sleeping well. He has more energy during the day. He denies concerns with machine or supplies.     History (copied from Dr Guadelupe Sabin previous note)  [name] is a 45 year old right-handed gentleman with an underlying medical history of gout, hypertension, and morbid obesity with a BMI of over 8, who presents for follow-up consultation of his severe obstructive sleep apnea after undergoing a split-night sleep study.  The patient is unaccompanied today.  I first met him on 05/22/2020 as a referral from the emergency room/hospital, as he was hospitalized for acute respiratory failure in the context of obstructive sleep apnea.  He was advised to proceed with a sleep study.  He had a split-night sleep study on 06/18/2020 which showed severe obstructive sleep apnea with an AHI at baseline of 110.6/h and severe as well as prolonged desaturations noted.  Emergency split-night protocol was initiated close to 11 PM after about 2 hours of test time.  Sleep latency was 5.5 minutes, REM sleep was absent prior to positive airway pressure initiation.  He had snoring and also high-pitched constricted  breathing sounds.  He was fitted with a full facemask and started on CPAP of 6 cm which was advanced to 13 cm.  Due to higher pressure requirements he was switched to standard BiPAP therapy of 14/10 centimeters and further titrated to a final pressure of 18/14 centimeters.  AHI at that point was 1.4/h, O2 nadir 88% with nonsupine and non-REM sleep achieved.  REM sleep was achieved on a pressure of 18/13 with an oxygen nadir of 87 and AHI of 9.7/h.  He was advised to start home BiPAP therapy at a pressure of 18/14 centimeters.  His set up date was 07/03/2020.   Today, 09/26/2020: I reviewed his BiPAP compliance data from 08/27/2020 through 09/25/2020, which is a total of 30 days, during which time he used his machine every night with percent use days greater than 4 hours at 90%, indicating excellent compliance with an average usage of 5 hours and 49 minutes, residual AHI at goal at 1.1/h, leak acceptable with a 95th percentile at 18 L/min on a pressure of 18/14.  He reports doing very well.  He has adjusted to treatment, mask leaks from time to time, he tried a traditionally shaped fullface mask but it was leaking around the nose.  He has an F 30 fullface mask and it does leak from time to time and then he tightens the headgear which is uncomfortable.  His leak is indeed fluctuating.  He would like to see about trying a nose mask.  He believes that he can keep his mouth closed fairly well at night.  His wife has given great feedback,  he is sleeping really well.  He feels much better, daytime energy is better, he is sleeping much more soundly and with good quality sleep.  The days that he was not able to use it more than 4 hours he was likely on call.  He is very pleased with his outcome thus far.  He has an appointment scheduled with the healthy weight and wellness clinic as well.  He is very motivated to work on weight loss and continue with his BiPAP   Observations/Objective:  Generalized: Well developed, in no  acute distress  Mentation: Alert oriented to time, place, history taking. Follows all commands speech and language fluent   Assessment and Plan:  45 y.o. year old male  has a past medical history of Asthma, Constipation, Gout, Hypertension, Joint pain after vaccination, Lactose intolerance, Other fatigue, Sleep apnea, and SOB (shortness of breath) on exertion. here with    ICD-10-CM   1. OSA treated with BiPAP  G47.33 For home use only DME Bipap      [name] continues to do very well with BiPAP therapy. Compliance report reveals excellent compliance. He was encouraged to continue using BiPAP nightly and greater than 4 hours each night. Supply orders sent. Healthy lifestyle habits encouraged. He will follow up in 1 year.   Orders Placed This Encounter  Procedures   For home use only DME Bipap    Supplies    Order Specific Question:   Length of Need    Answer:   Lifetime    Order Specific Question:   Inspiratory pressure    Answer:   OTHER SEE COMMENTS    Order Specific Question:   Expiratory pressure    Answer:   OTHER SEE COMMENTS    No orders of the defined types were placed in this encounter.    Follow Up Instructions:  I discussed the assessment and treatment plan with the patient. The patient was provided an opportunity to ask questions and all were answered. The patient agreed with the plan and demonstrated an understanding of the instructions.   The patient was advised to call back or seek an in-person evaluation if the symptoms worsen or if the condition fails to improve as anticipated.  I provided 15 minutes of non-face-to-face time during this encounter. Patient located at their place of residence during No Name visit. Provider is in the office.    Debbora Presto, NP

## 2021-03-31 ENCOUNTER — Telehealth (INDEPENDENT_AMBULATORY_CARE_PROVIDER_SITE_OTHER): Payer: 59 | Admitting: Family Medicine

## 2021-03-31 ENCOUNTER — Encounter: Payer: Self-pay | Admitting: Family Medicine

## 2021-03-31 DIAGNOSIS — G4733 Obstructive sleep apnea (adult) (pediatric): Secondary | ICD-10-CM | POA: Diagnosis not present

## 2021-03-31 NOTE — Progress Notes (Signed)
CM sent to [facility] 

## 2021-04-21 ENCOUNTER — Encounter (INDEPENDENT_AMBULATORY_CARE_PROVIDER_SITE_OTHER): Payer: Self-pay

## 2021-04-22 ENCOUNTER — Ambulatory Visit (INDEPENDENT_AMBULATORY_CARE_PROVIDER_SITE_OTHER): Payer: 59 | Admitting: Family Medicine

## 2021-04-23 ENCOUNTER — Other Ambulatory Visit (INDEPENDENT_AMBULATORY_CARE_PROVIDER_SITE_OTHER): Payer: Self-pay | Admitting: Family Medicine

## 2021-04-23 DIAGNOSIS — E559 Vitamin D deficiency, unspecified: Secondary | ICD-10-CM

## 2021-04-24 NOTE — Telephone Encounter (Signed)
Dr.Opalski ?

## 2022-02-04 ENCOUNTER — Encounter (INDEPENDENT_AMBULATORY_CARE_PROVIDER_SITE_OTHER): Payer: Self-pay

## 2022-04-02 NOTE — Patient Instructions (Signed)
Please continue using your BiPAP regularly. While your insurance requires that you use BiPAP at least 4 hours each night on 70% of the nights, I recommend, that you not skip any nights and use it throughout the night if you can. Getting used to BiPAP and staying with the treatment long term does take time and patience and discipline. Untreated obstructive sleep apnea when it is moderate to severe can have an adverse impact on cardiovascular health and raise her risk for heart disease, arrhythmias, hypertension, congestive heart failure, stroke and diabetes. Untreated obstructive sleep apnea causes sleep disruption, nonrestorative sleep, and sleep deprivation. This can have an impact on your day to day functioning and cause daytime sleepiness and impairment of cognitive function, memory loss, mood disturbance, and problems focussing. Using BiPAP regularly can improve these symptoms.   Follow up in 1 year  

## 2022-04-02 NOTE — Progress Notes (Signed)
PATIENT: Donald Dixon DOB: 04-11-1976  REASON FOR VISIT: follow up HISTORY FROM: patient  Virtual Visit via Telephone Note  I connected with Donald Dixon on 04/07/22 at  9:15 AM EDT by telephone and verified that I am speaking with the correct person using two identifiers.   I discussed the limitations, risks, security and privacy concerns of performing an evaluation and management service by telephone and the availability of in person appointments. I also discussed with the patient that there may be a patient responsible charge related to this service. The patient expressed understanding and agreed to proceed.   History of Present Illness:  04/07/22 ALL (Mychart): Donald Dixon returns for follow up for OSA on BiPAP. He continues to do well on therapy. He is using BiPAP nightly for about 6.5 hours. He denies concerns with supplies or machine. He feels benefit with using BiPAP.     03/31/2021 ALL (Mychart):  Donald Dixon is a 46 y.o. male here today for follow up for OSA on BiPAP. Set up date 07/03/2020. He continues to do well with therapy. He is sleeping well. He has more energy during the day. He denies concerns with machine or supplies.     History (copied from Dr Guadelupe Sabin previous note)  Donald. Dixon is a 45 year old right-handed gentleman with an underlying medical history of gout, hypertension, and morbid obesity with a BMI of over 21, who presents for follow-up consultation of his severe obstructive sleep apnea after undergoing a split-night sleep study.  The patient is unaccompanied today.  I first met him on 05/22/2020 as a referral from the emergency room/hospital, as he was hospitalized for acute respiratory failure in the context of obstructive sleep apnea.  He was advised to proceed with a sleep study.  He had a split-night sleep study on 06/18/2020 which showed severe obstructive sleep apnea with an AHI at baseline of 110.6/h and severe as well as prolonged desaturations  noted.  Emergency split-night protocol was initiated close to 11 PM after about 2 hours of test time.  Sleep latency was 5.5 minutes, REM sleep was absent prior to positive airway pressure initiation.  He had snoring and also high-pitched constricted breathing sounds.  He was fitted with a full facemask and started on CPAP of 6 cm which was advanced to 13 cm.  Due to higher pressure requirements he was switched to standard BiPAP therapy of 14/10 centimeters and further titrated to a final pressure of 18/14 centimeters.  AHI at that point was 1.4/h, O2 nadir 88% with nonsupine and non-REM sleep achieved.  REM sleep was achieved on a pressure of 18/13 with an oxygen nadir of 87 and AHI of 9.7/h.  He was advised to start home BiPAP therapy at a pressure of 18/14 centimeters.  His set up date was 07/03/2020.   Today, 09/26/2020: I reviewed his BiPAP compliance data from 08/27/2020 through 09/25/2020, which is a total of 30 days, during which time he used his machine every night with percent use days greater than 4 hours at 90%, indicating excellent compliance with an average usage of 5 hours and 49 minutes, residual AHI at goal at 1.1/h, leak acceptable with a 95th percentile at 18 L/min on a pressure of 18/14.  He reports doing very well.  He has adjusted to treatment, mask leaks from time to time, he tried a traditionally shaped fullface mask but it was leaking around the nose.  He has an F 30 fullface mask and it does leak from time to  time and then he tightens the headgear which is uncomfortable.  His leak is indeed fluctuating.  He would like to see about trying a nose mask.  He believes that he can keep his mouth closed fairly well at night.  His wife has given great feedback, he is sleeping really well.  He feels much better, daytime energy is better, he is sleeping much more soundly and with good quality sleep.  The days that he was not able to use it more than 4 hours he was likely on call.  He is very pleased with  his outcome thus far.  He has an appointment scheduled with the healthy weight and wellness clinic as well.  He is very motivated to work on weight loss and continue with his BiPAP   Observations/Objective:  Generalized: Well developed, in no acute distress  Mentation: Alert oriented to time, place, history taking. Follows all commands speech and language fluent   Assessment and Plan:  46 y.o. year old male  has a past medical history of Asthma, Constipation, Gout, Hypertension, Joint pain after vaccination, Lactose intolerance, Other fatigue, Sleep apnea, and SOB (shortness of breath) on exertion. here with    ICD-10-CM   1. OSA treated with BiPAP  G47.33 For home use only DME Bipap      Donald Dixon continues to do very well with BiPAP therapy. Compliance report reveals excellent compliance. He was encouraged to continue using BiPAP nightly and greater than 4 hours each night. Supply orders sent. Healthy lifestyle habits encouraged. He will follow up in 1 year.    Orders Placed This Encounter  Procedures   For home use only DME Bipap    Supplies    Order Specific Question:   Length of Need    Answer:   Lifetime    Order Specific Question:   Inspiratory pressure    Answer:   OTHER SEE COMMENTS    Order Specific Question:   Expiratory pressure    Answer:   OTHER SEE COMMENTS    No orders of the defined types were placed in this encounter.    Follow Up Instructions:  I discussed the assessment and treatment plan with the patient. The patient was provided an opportunity to ask questions and all were answered. The patient agreed with the plan and demonstrated an understanding of the instructions.   The patient was advised to call back or seek an in-person evaluation if the symptoms worsen or if the condition fails to improve as anticipated.  I provided 15 minutes of non-face-to-face time during this encounter. Patient located at their place of residence during [place] visit. Provider  is in the office.    Debbora Presto, NP

## 2022-04-07 ENCOUNTER — Encounter: Payer: Self-pay | Admitting: Family Medicine

## 2022-04-07 ENCOUNTER — Telehealth: Payer: Self-pay

## 2022-04-07 ENCOUNTER — Telehealth: Payer: 59 | Admitting: Family Medicine

## 2022-04-07 DIAGNOSIS — G4733 Obstructive sleep apnea (adult) (pediatric): Secondary | ICD-10-CM

## 2022-06-20 LAB — COLOGUARD: COLOGUARD: NEGATIVE

## 2022-08-31 IMAGING — CT CT HEAD W/O CM
3 series · 15 of 47 positions shown, 18 images · non-contrast
Comparison: None.

CLINICAL DATA: 44-year-old male with fall, altered mental status
and shortness of breath.

EXAM:
CT HEAD WITHOUT CONTRAST
TECHNIQUE: Contiguous axial images were obtained from the base of the skull
through the vertex without intravenous contrast.

[Series 2: head wo · axial · 0.48mm/px · z∈[-374,-240]mm · 9 of 33 slices shown, 12 images]
[im 3/33  brain]
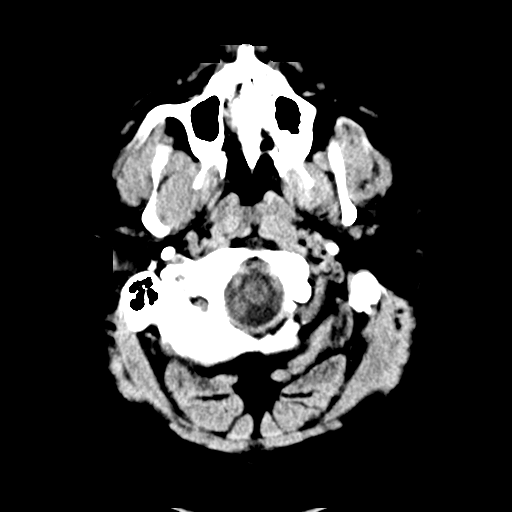
[im 3/33  bone]
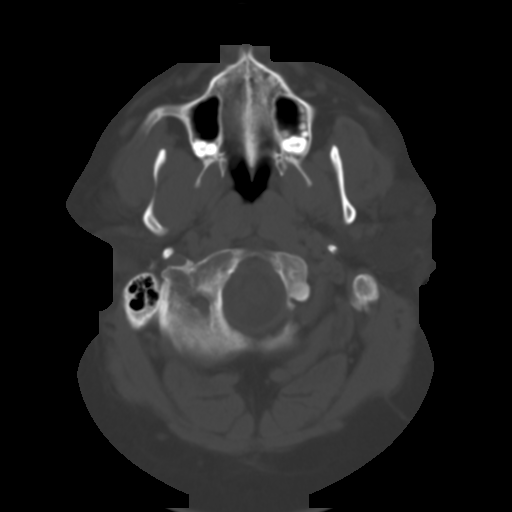
[im 6/33  brain]
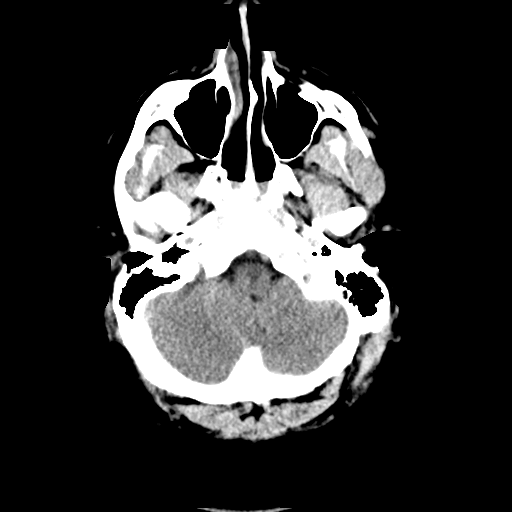
[im 9/33  brain]
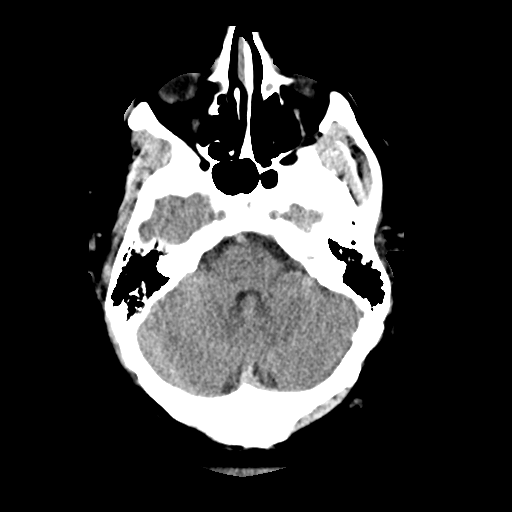
[im 13/33  brain]
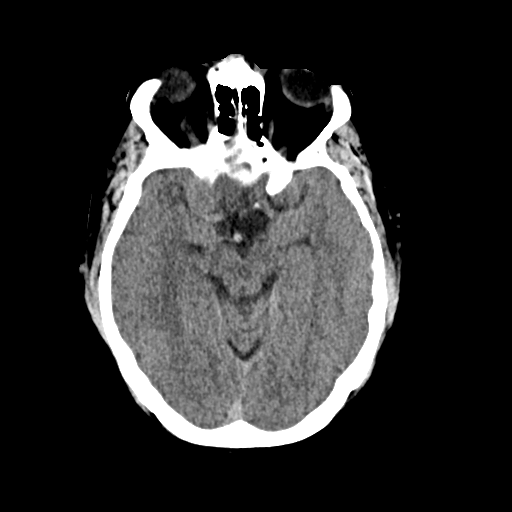
[im 17/33  brain]
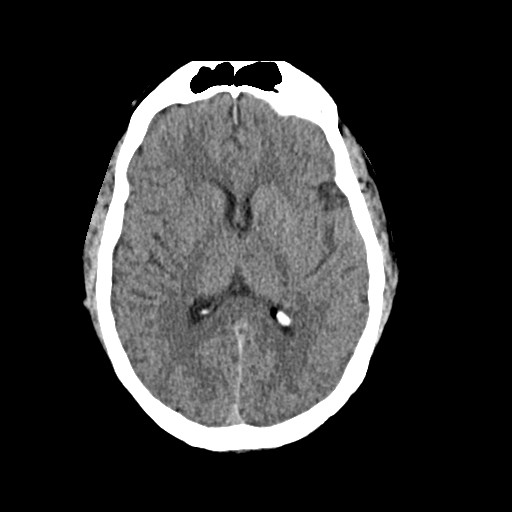
[im 17/33  bone]
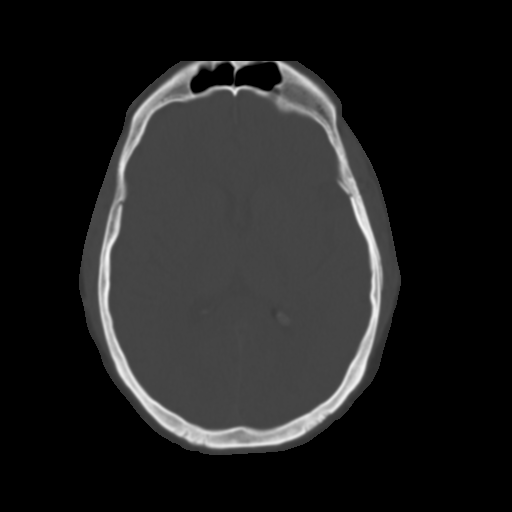
[im 20/33  brain]
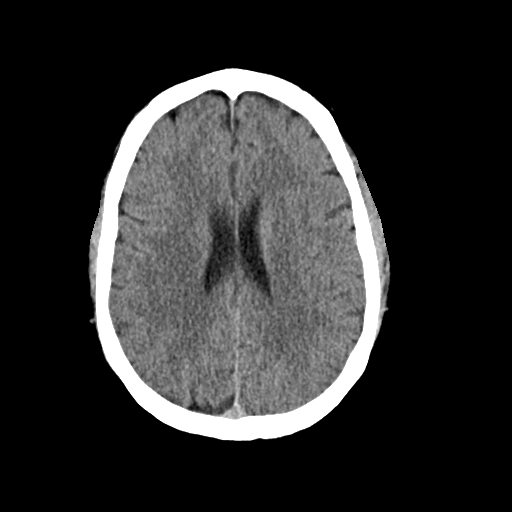
[im 24/33  brain]
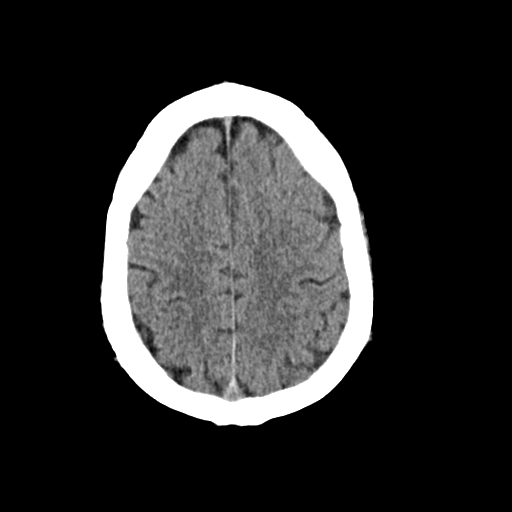
[im 27/33  brain]
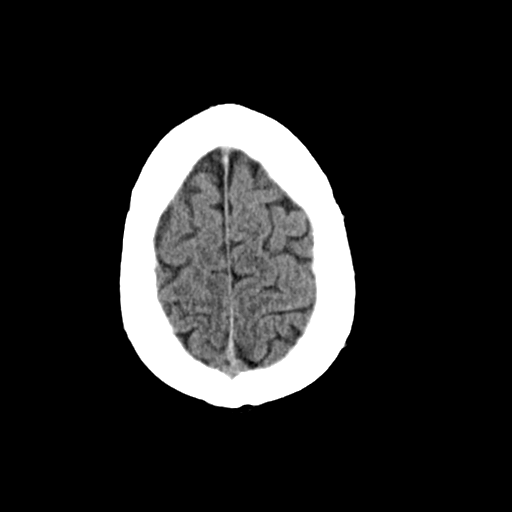
[im 30/33  brain]
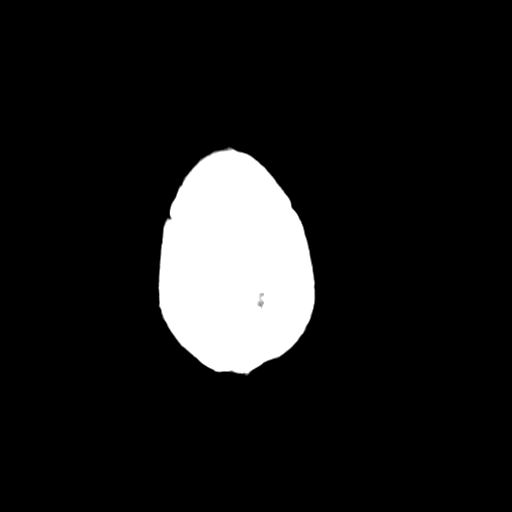
[im 30/33  bone]
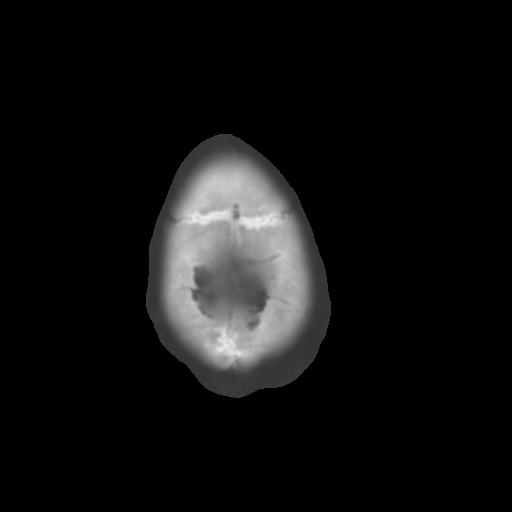

[Series 4: coronal soft · coronal · 0.35mm/px · 3 of 80 slices shown]
[im 27/80  brain]
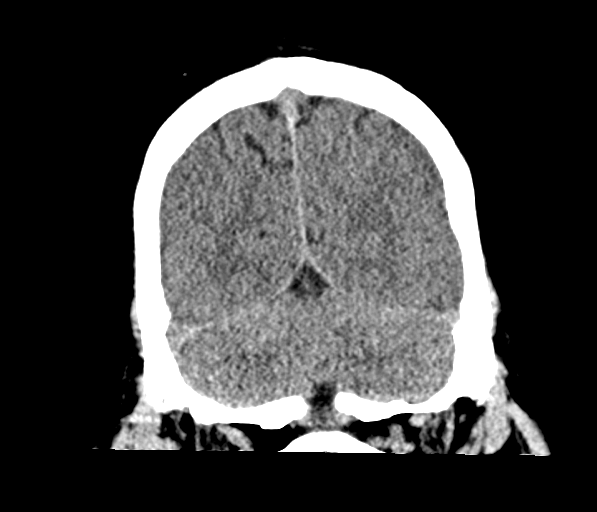
[im 36/80  brain]
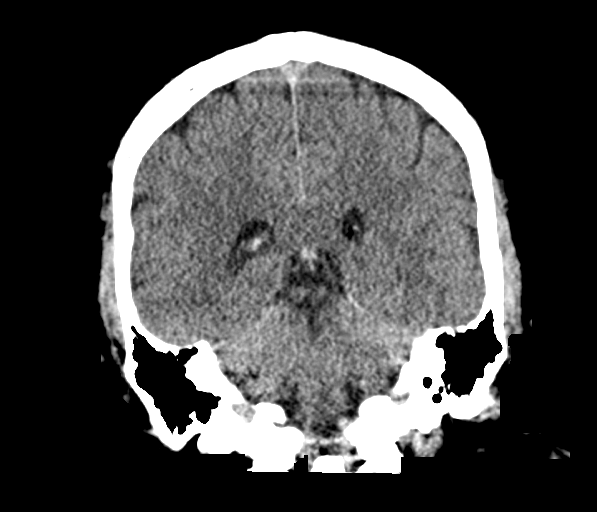
[im 44/80  brain]
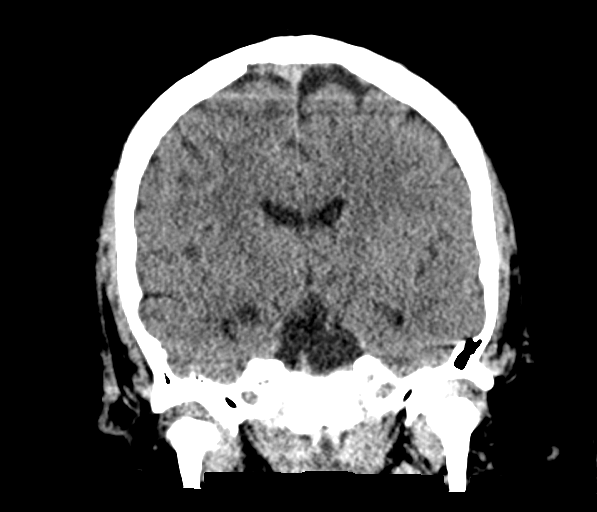

[Series 5: sag soft · sagittal · 0.36mm/px · 3 of 69 slices shown]
[im 23/69  brain]
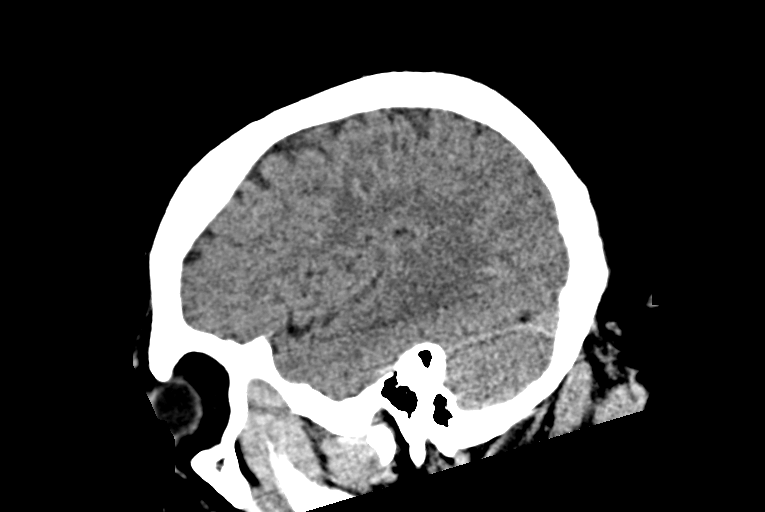
[im 35/69  brain]
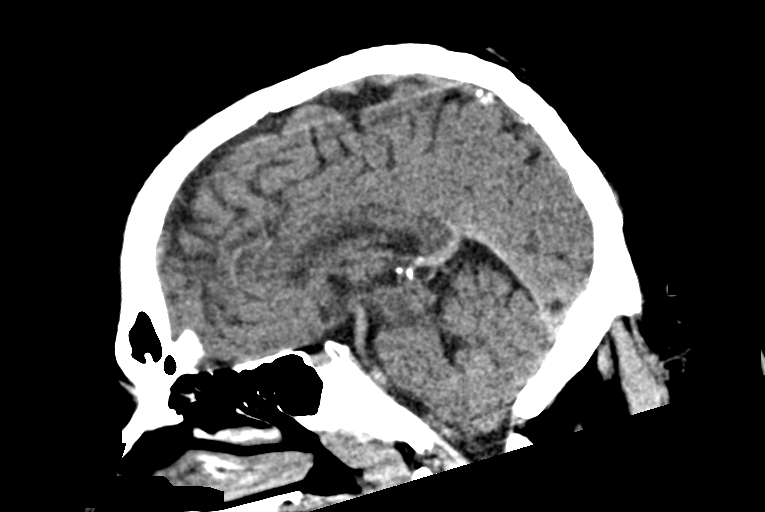
[im 46/69  brain]
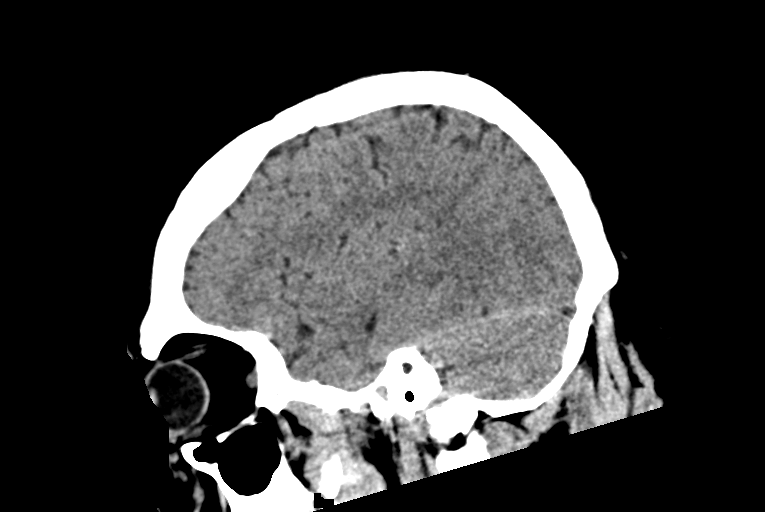

[15 of 47 positions shown; findings below may reference images not displayed]

FINDINGS: Brain: No evidence of acute infarction, hemorrhage, hydrocephalus,
extra-axial collection or mass lesion/mass effect.

Vascular: No hyperdense vessel or unexpected calcification.

Skull: Normal. Negative for fracture or focal lesion.

Sinuses/Orbits: No acute finding.

Other: None.
IMPRESSION: Unremarkable noncontrast head CT.

## 2023-04-08 NOTE — Patient Instructions (Signed)
Please continue using your BiPAP regularly. While your insurance requires that you use BiPAP at least 4 hours each night on 70% of the nights, I recommend, that you not skip any nights and use it throughout the night if you can. Getting used to BiPAP and staying with the treatment long term does take time and patience and discipline. Untreated obstructive sleep apnea when it is moderate to severe can have an adverse impact on cardiovascular health and raise her risk for heart disease, arrhythmias, hypertension, congestive heart failure, stroke and diabetes. Untreated obstructive sleep apnea causes sleep disruption, nonrestorative sleep, and sleep deprivation. This can have an impact on your day to day functioning and cause daytime sleepiness and impairment of cognitive function, memory loss, mood disturbance, and problems focussing. Using BiPAP regularly can improve these symptoms.  We will update supply orders, today.  Follow up in 1 year    

## 2023-04-08 NOTE — Progress Notes (Signed)
PATIENT: Donald Dixon DOB: June 06, 1976  REASON FOR VISIT: follow up HISTORY FROM: patient  Virtual Visit via Telephone Note  I connected with Donald Dixon on 04/13/23 at  8:30 AM EDT by telephone and verified that I am speaking with the correct person using two identifiers.   I discussed the limitations, risks, security and privacy concerns of performing an evaluation and management service by telephone and the availability of in person appointments. I also discussed with the patient that there may be a patient responsible charge related to this service. The patient expressed understanding and agreed to proceed.   History of Present Illness:  04/13/23 ALL (Mychart): Donald Dixon returns for follow up for OSA on BiPAP. He continues to do well on therapy. He is using BiPAP nightly for about 7 hours, on average. He does note benefit of using therapy. He denies concerns with machine or supplies.     04/07/2022 (Mychart):  Donald Dixon returns for follow up for OSA on BiPAP. He continues to do well on therapy. He is using BiPAP nightly for about 6.5 hours. He denies concerns with supplies or machine. He feels benefit with using BiPAP.     03/31/2021 ALL (Mychart):  Donald Dixon is a 47 y.o. male here today for follow up for OSA on BiPAP. Set up date 07/03/2020. He continues to do well with therapy. He is sleeping well. He has more energy during the day. He denies concerns with machine or supplies.     History (copied from Dr Teofilo Pod previous note)  Mr. Lofstrom is a 47 year old right-handed gentleman with an underlying medical history of gout, hypertension, and morbid obesity with a BMI of over 40, who presents for follow-up consultation of his severe obstructive sleep apnea after undergoing a split-night sleep study.  The patient is unaccompanied today.  I first met him on 05/22/2020 as a referral from the emergency room/hospital, as he was hospitalized for acute respiratory failure in the  context of obstructive sleep apnea.  He was advised to proceed with a sleep study.  He had a split-night sleep study on 06/18/2020 which showed severe obstructive sleep apnea with an AHI at baseline of 110.6/h and severe as well as prolonged desaturations noted.  Emergency split-night protocol was initiated close to 11 PM after about 2 hours of test time.  Sleep latency was 5.5 minutes, REM sleep was absent prior to positive airway pressure initiation.  He had snoring and also high-pitched constricted breathing sounds.  He was fitted with a full facemask and started on CPAP of 6 cm which was advanced to 13 cm.  Due to higher pressure requirements he was switched to standard BiPAP therapy of 14/10 centimeters and further titrated to a final pressure of 18/14 centimeters.  AHI at that point was 1.4/h, O2 nadir 88% with nonsupine and non-REM sleep achieved.  REM sleep was achieved on a pressure of 18/13 with an oxygen nadir of 87 and AHI of 9.7/h.  He was advised to start home BiPAP therapy at a pressure of 18/14 centimeters.  His set up date was 07/03/2020.   Today, 09/26/2020: I reviewed his BiPAP compliance data from 08/27/2020 through 09/25/2020, which is a total of 30 days, during which time he used his machine every night with percent use days greater than 4 hours at 90%, indicating excellent compliance with an average usage of 5 hours and 49 minutes, residual AHI at goal at 1.1/h, leak acceptable with a 95th percentile at 18 L/min on a pressure of  18/14.  He reports doing very well.  He has adjusted to treatment, mask leaks from time to time, he tried a traditionally shaped fullface mask but it was leaking around the nose.  He has an F 30 fullface mask and it does leak from time to time and then he tightens the headgear which is uncomfortable.  His leak is indeed fluctuating.  He would like to see about trying a nose mask.  He believes that he can keep his mouth closed fairly well at night.  His wife has given great  feedback, he is sleeping really well.  He feels much better, daytime energy is better, he is sleeping much more soundly and with good quality sleep.  The days that he was not able to use it more than 4 hours he was likely on call.  He is very pleased with his outcome thus far.  He has an appointment scheduled with the healthy weight and wellness clinic as well.  He is very motivated to work on weight loss and continue with his BiPAP   Observations/Objective:  Generalized: Well developed, in no acute distress  Mentation: Alert oriented to time, place, history taking. Follows all commands speech and language fluent   Assessment and Plan:  47 y.o. year old male  has a past medical history of Asthma, Constipation, Gout, Hypertension, Joint pain after vaccination, Lactose intolerance, Other fatigue, Sleep apnea, and SOB (shortness of breath) on exertion. here with    ICD-10-CM   1. OSA treated with BiPAP  G47.33 For home use only DME Bipap     Donald Dixon continues to do very well with BiPAP therapy. Compliance report reveals excellent compliance. He was encouraged to continue using BiPAP nightly and greater than 4 hours each night. Supply orders sent. Healthy lifestyle habits encouraged. He will follow up in 1 year.    Orders Placed This Encounter  Procedures   For home use only DME Bipap    Supplies    Order Specific Question:   Length of Need    Answer:   Lifetime    Order Specific Question:   Inspiratory pressure    Answer:   OTHER SEE COMMENTS    Order Specific Question:   Expiratory pressure    Answer:   OTHER SEE COMMENTS    No orders of the defined types were placed in this encounter.    Follow Up Instructions:  I discussed the assessment and treatment plan with the patient. The patient was provided an opportunity to ask questions and all were answered. The patient agreed with the plan and demonstrated an understanding of the instructions.   The patient was advised to call back or  seek an in-person evaluation if the symptoms worsen or if the condition fails to improve as anticipated.  I provided 15 minutes of non-face-to-face time during this encounter. Patient located at their place of residence during Mychart visit. Provider is in the office.    Shawnie Dapper, NP

## 2023-04-12 ENCOUNTER — Telehealth: Payer: Self-pay

## 2023-04-12 NOTE — Telephone Encounter (Signed)
Please See MyChart message from today 04/12/2023

## 2023-04-13 ENCOUNTER — Encounter: Payer: Self-pay | Admitting: Family Medicine

## 2023-04-13 ENCOUNTER — Telehealth: Payer: 59 | Admitting: Family Medicine

## 2023-04-13 DIAGNOSIS — G4733 Obstructive sleep apnea (adult) (pediatric): Secondary | ICD-10-CM

## 2024-05-01 NOTE — Patient Instructions (Signed)

## 2024-05-01 NOTE — Progress Notes (Unsigned)
 Donald Dixon

## 2024-05-01 NOTE — Progress Notes (Unsigned)
 PATIENT: Donald Dixon DOB: 1975-07-19  REASON FOR VISIT: follow up HISTORY FROM: patient  Virtual Visit via Telephone Note  I connected with Morene Pouch on 05/02/24 at  8:00 AM EST by telephone and verified that I am speaking with the correct person using two identifiers.   I discussed the limitations, risks, security and privacy concerns of performing an evaluation and management service by telephone and the availability of in person appointments. I also discussed with the patient that there may be a patient responsible charge related to this service. The patient expressed understanding and agreed to proceed.   History of Present Illness:  05/02/24 ALL (Mychart): Donald Dixon returns for follow up for OSA on BiPAP. He is doing well on therapy. He uses BiPAP nightly for about 7 hours, on average. He denies concerns with machine or supplies. He is considering a travel machine for camping trips. He is feeling well and without concerns.     04/13/2023 ALL (Mychart): Donald Dixon returns for follow up for OSA on BiPAP. He continues to do well on therapy. He is using BiPAP nightly for about 7 hours, on average. He does note benefit of using therapy. He denies concerns with machine or supplies.     04/07/2022 (Mychart):  Donald Dixon returns for follow up for OSA on BiPAP. He continues to do well on therapy. He is using BiPAP nightly for about 6.5 hours. He denies concerns with supplies or machine. He feels benefit with using BiPAP.     03/31/2021 ALL (Mychart):  Donald Dixon is a 48 y.o. male here today for follow up for OSA on BiPAP. Set up date 07/03/2020. He continues to do well with therapy. He is sleeping well. He has more energy during the day. He denies concerns with machine or supplies.     History (copied from Dr Obie previous note)  Donald Dixon is a 48 year old right-handed gentleman with an underlying medical history of gout, hypertension, and morbid obesity with a BMI of over 40,  who presents for follow-up consultation of his severe obstructive sleep apnea after undergoing a split-night sleep study.  The patient is unaccompanied today.  I first met him on 05/22/2020 as a referral from the emergency room/hospital, as he was hospitalized for acute respiratory failure in the context of obstructive sleep apnea.  He was advised to proceed with a sleep study.  He had a split-night sleep study on 06/18/2020 which showed severe obstructive sleep apnea with an AHI at baseline of 110.6/h and severe as well as prolonged desaturations noted.  Emergency split-night protocol was initiated close to 11 PM after about 2 hours of test time.  Sleep latency was 5.5 minutes, REM sleep was absent prior to positive airway pressure initiation.  He had snoring and also high-pitched constricted breathing sounds.  He was fitted with a full facemask and started on CPAP of 6 cm which was advanced to 13 cm.  Due to higher pressure requirements he was switched to standard BiPAP therapy of 14/10 centimeters and further titrated to a final pressure of 18/14 centimeters.  AHI at that point was 1.4/h, O2 nadir 88% with nonsupine and non-REM sleep achieved.  REM sleep was achieved on a pressure of 18/13 with an oxygen nadir of 87 and AHI of 9.7/h.  He was advised to start home BiPAP therapy at a pressure of 18/14 centimeters.  His set up date was 07/03/2020.   Today, 09/26/2020: I reviewed his BiPAP compliance data from 08/27/2020 through 09/25/2020, which is a  total of 30 days, during which time he used his machine every night with percent use days greater than 4 hours at 90%, indicating excellent compliance with an average usage of 5 hours and 49 minutes, residual AHI at goal at 1.1/h, leak acceptable with a 95th percentile at 18 L/min on a pressure of 18/14.  He reports doing very well.  He has adjusted to treatment, mask leaks from time to time, he tried a traditionally shaped fullface mask but it was leaking around the nose.   He has an F 30 fullface mask and it does leak from time to time and then he tightens the headgear which is uncomfortable.  His leak is indeed fluctuating.  He would like to see about trying a nose mask.  He believes that he can keep his mouth closed fairly well at night.  His wife has given great feedback, he is sleeping really well.  He feels much better, daytime energy is better, he is sleeping much more soundly and with good quality sleep.  The days that he was not able to use it more than 4 hours he was likely on call.  He is very pleased with his outcome thus far.  He has an appointment scheduled with the [facility] as well.  He is very motivated to work on weight loss and continue with his BiPAP   Observations/Objective:  Generalized: Well developed, in no acute distress  Mentation: Alert oriented to time, place, history taking. Follows all commands speech and language fluent   Assessment and Plan:  48 y.o. year old male  has a past medical history of Asthma, Constipation, Gout, Hypertension, Joint pain after vaccination, Lactose intolerance, Other fatigue, Sleep apnea, and SOB (shortness of breath) on exertion. here with    ICD-10-CM   1. OSA treated with BiPAP  G47.33 For home use only DME Bipap      Donald Dixon continues to do very well with BiPAP therapy. Compliance report reveals excellent compliance. He was encouraged to continue using BiPAP nightly and greater than 4 hours each night. Supply orders sent. I would be happy to send orders for travel BiPAP if he wishes. Healthy lifestyle habits encouraged. He will follow up in 1 year.    Orders Placed This Encounter  Procedures   For home use only DME Bipap    Supplies    Length of Need:   Lifetime    Inspiratory pressure:   OTHER SEE COMMENTS    Expiratory pressure:   OTHER SEE COMMENTS    No orders of the defined types were placed in this encounter.    Follow Up Instructions:  I discussed the assessment  and treatment plan with the patient. The patient was provided an opportunity to ask questions and all were answered. The patient agreed with the plan and demonstrated an understanding of the instructions.   The patient was advised to call back or seek an in-person evaluation if the symptoms worsen or if the condition fails to improve as anticipated.  I provided 15 minutes of non-face-to-face time during this encounter. Patient located at their place of residence during Mychart visit. Provider is in the office.    Violette Morneault, NP

## 2024-05-02 ENCOUNTER — Encounter: Payer: Self-pay | Admitting: Family Medicine

## 2024-05-02 ENCOUNTER — Telehealth: Payer: 59 | Admitting: Family Medicine

## 2024-05-02 DIAGNOSIS — G4733 Obstructive sleep apnea (adult) (pediatric): Secondary | ICD-10-CM | POA: Diagnosis not present

## 2025-05-22 ENCOUNTER — Telehealth: Admitting: Family Medicine
